# Patient Record
Sex: Male | Born: 1955 | Race: White | Hispanic: No | Marital: Married | Smoking: Never smoker
Health system: Southern US, Community
[De-identification: ages and names within clinical notes are randomized; demographics above are authoritative.]

## PROBLEM LIST (undated history)

## (undated) DIAGNOSIS — N400 Enlarged prostate without lower urinary tract symptoms: Secondary | ICD-10-CM

## (undated) DIAGNOSIS — K635 Polyp of colon: Secondary | ICD-10-CM

## (undated) DIAGNOSIS — E785 Hyperlipidemia, unspecified: Secondary | ICD-10-CM

## (undated) DIAGNOSIS — I10 Essential (primary) hypertension: Secondary | ICD-10-CM

## (undated) HISTORY — DX: Essential (primary) hypertension: I10

## (undated) HISTORY — DX: Benign prostatic hyperplasia without lower urinary tract symptoms: N40.0

## (undated) HISTORY — PX: SPINE SURGERY: SHX786

## (undated) HISTORY — DX: Polyp of colon: K63.5

## (undated) HISTORY — DX: Hyperlipidemia, unspecified: E78.5

---

## 1999-12-06 ENCOUNTER — Encounter: Admission: RE | Admit: 1999-12-06 | Discharge: 2000-03-05 | Payer: Self-pay | Admitting: Family Medicine

## 2001-08-13 ENCOUNTER — Encounter: Admission: RE | Admit: 2001-08-13 | Discharge: 2001-11-11 | Payer: Self-pay | Admitting: Family Medicine

## 2004-02-22 ENCOUNTER — Ambulatory Visit (HOSPITAL_COMMUNITY)
Admission: RE | Admit: 2004-02-22 | Discharge: 2004-02-22 | Payer: Self-pay | Admitting: Physical Medicine and Rehabilitation

## 2007-09-30 ENCOUNTER — Ambulatory Visit: Payer: Self-pay | Admitting: Gastroenterology

## 2007-10-03 HISTORY — PX: COLONOSCOPY: SHX174

## 2007-10-07 ENCOUNTER — Ambulatory Visit: Payer: Self-pay | Admitting: Gastroenterology

## 2007-10-07 ENCOUNTER — Encounter: Payer: Self-pay | Admitting: Gastroenterology

## 2007-11-18 ENCOUNTER — Ambulatory Visit (HOSPITAL_COMMUNITY): Admission: RE | Admit: 2007-11-18 | Discharge: 2007-11-18 | Payer: Self-pay | Admitting: Family Medicine

## 2007-11-25 ENCOUNTER — Ambulatory Visit (HOSPITAL_COMMUNITY): Admission: RE | Admit: 2007-11-25 | Discharge: 2007-11-25 | Payer: Self-pay | Admitting: Urology

## 2009-07-21 ENCOUNTER — Inpatient Hospital Stay (HOSPITAL_COMMUNITY): Admission: EM | Admit: 2009-07-21 | Discharge: 2009-07-25 | Payer: Self-pay | Admitting: Emergency Medicine

## 2009-07-21 ENCOUNTER — Encounter (INDEPENDENT_AMBULATORY_CARE_PROVIDER_SITE_OTHER): Payer: Self-pay | Admitting: Internal Medicine

## 2009-07-21 ENCOUNTER — Ambulatory Visit: Payer: Self-pay | Admitting: Cardiovascular Disease

## 2009-07-21 ENCOUNTER — Ambulatory Visit: Payer: Self-pay | Admitting: Infectious Disease

## 2011-01-05 LAB — CLOSTRIDIUM DIFFICILE EIA
C difficile Toxins A+B, EIA: NEGATIVE
C difficile Toxins A+B, EIA: NEGATIVE

## 2011-01-05 LAB — HIV-1 RNA ULTRAQUANT REFLEX TO GENTYP+
HIV 1 RNA Quant: <48 copies/mL (ref <48)
HIV-1 RNA Quant, Log: <1.68 {Log} (ref <1.68)

## 2011-01-05 LAB — COMPREHENSIVE METABOLIC PANEL
ALT: 78 U/L — ABNORMAL HIGH (ref 0–53)
AST: 37 U/L (ref 0–37)
Albumin: 2 g/dL — ABNORMAL LOW (ref 3.5–5.2)
Calcium: 7.1 mg/dL — ABNORMAL LOW (ref 8.4–10.5)
Creatinine, Ser: 0.76 mg/dL (ref 0.4–1.5)
GFR calc Af Amer: >60 mL/min (ref >60)
Sodium: 129 mEq/L — ABNORMAL LOW (ref 135–145)
Total Protein: 4.7 g/dL — ABNORMAL LOW (ref 6.0–8.3)

## 2011-01-05 LAB — TSH: TSH: 1.315 u[IU]/mL (ref 0.350–4.500)

## 2011-01-05 LAB — DIFFERENTIAL
Lymphocytes Relative: 11 % — ABNORMAL LOW (ref 12–46)
Lymphs Abs: 1 10*3/uL (ref 0.7–4.0)
Neutro Abs: 7.4 10*3/uL (ref 1.7–7.7)

## 2011-01-05 LAB — BASIC METABOLIC PANEL
BUN: 12 mg/dL (ref 6–23)
CO2: 23 mEq/L (ref 19–32)
Calcium: 7.7 mg/dL — ABNORMAL LOW (ref 8.4–10.5)
Calcium: 8.1 mg/dL — ABNORMAL LOW (ref 8.4–10.5)
Calcium: 8.2 mg/dL — ABNORMAL LOW (ref 8.4–10.5)
Chloride: 106 mEq/L (ref 96–112)
Creatinine, Ser: 0.7 mg/dL (ref 0.4–1.5)
Creatinine, Ser: 0.71 mg/dL (ref 0.4–1.5)
GFR calc Af Amer: >60 mL/min (ref >60)
GFR calc Af Amer: >60 mL/min (ref >60)
GFR calc Af Amer: >60 mL/min (ref >60)
GFR calc non Af Amer: >60 mL/min (ref >60)
GFR calc non Af Amer: >60 mL/min (ref >60)
GFR calc non Af Amer: >60 mL/min (ref >60)
GFR calc non Af Amer: >60 mL/min (ref >60)
Glucose, Bld: 132 mg/dL — ABNORMAL HIGH (ref 70–99)
Glucose, Bld: 92 mg/dL (ref 70–99)
Glucose, Bld: 97 mg/dL (ref 70–99)
Potassium: 3.6 mEq/L (ref 3.5–5.1)
Potassium: 4.1 mEq/L (ref 3.5–5.1)
Sodium: 133 mEq/L — ABNORMAL LOW (ref 135–145)
Sodium: 134 mEq/L — ABNORMAL LOW (ref 135–145)
Sodium: 136 mEq/L (ref 135–145)

## 2011-01-05 LAB — CULTURE, BLOOD (ROUTINE X 2)

## 2011-01-05 LAB — POCT I-STAT, CHEM 8
BUN: 17 mg/dL (ref 6–23)
Calcium, Ion: 1.11 mmol/L — ABNORMAL LOW (ref 1.12–1.32)
Creatinine, Ser: 1 mg/dL (ref 0.4–1.5)
Glucose, Bld: 140 mg/dL — ABNORMAL HIGH (ref 70–99)
Hemoglobin: 11.9 g/dL — ABNORMAL LOW (ref 13.0–17.0)
Potassium: 3 mEq/L — ABNORMAL LOW (ref 3.5–5.1)
TCO2: 20 mmol/L (ref 0–100)

## 2011-01-05 LAB — RPR: RPR Ser Ql: NONREACTIVE

## 2011-01-05 LAB — ENTEROVIRUS PCR: Enterovirus PCR: NOT DETECTED

## 2011-01-05 LAB — CBC
HCT: 32.1 % — ABNORMAL LOW (ref 39.0–52.0)
HCT: 33 % — ABNORMAL LOW (ref 39.0–52.0)
HCT: 37.9 % — ABNORMAL LOW (ref 39.0–52.0)
Hemoglobin: 12.2 g/dL — ABNORMAL LOW (ref 13.0–17.0)
Hemoglobin: 13.1 g/dL (ref 13.0–17.0)
MCHC: 34.5 g/dL (ref 30.0–36.0)
MCHC: 34.7 g/dL (ref 30.0–36.0)
MCV: 90.9 fL (ref 78.0–100.0)
Platelets: 176 10*3/uL (ref 150–400)
Platelets: 206 10*3/uL (ref 150–400)
Platelets: 214 10*3/uL (ref 150–400)
Platelets: 286 10*3/uL (ref 150–400)
RBC: 3.31 MIL/uL — ABNORMAL LOW (ref 4.22–5.81)
RBC: 3.63 MIL/uL — ABNORMAL LOW (ref 4.22–5.81)
RBC: 4.14 MIL/uL — ABNORMAL LOW (ref 4.22–5.81)
RDW: 13.4 % (ref 11.5–15.5)
RDW: 13.5 % (ref 11.5–15.5)
RDW: 13.6 % (ref 11.5–15.5)
RDW: 13.6 % (ref 11.5–15.5)
WBC: 7.2 10*3/uL (ref 4.0–10.5)
WBC: 7.6 10*3/uL (ref 4.0–10.5)
WBC: 7.6 10*3/uL (ref 4.0–10.5)

## 2011-01-05 LAB — CSF CELL COUNT WITH DIFFERENTIAL
Lymphs, CSF: 21 % — ABNORMAL LOW (ref 40–80)
RBC Count, CSF: 21 /mm3 — ABNORMAL HIGH
Segmented Neutrophils-CSF: 47 % — ABNORMAL HIGH (ref 0–6)
Tube #: 1
WBC, CSF: 190 /mm3 — ABNORMAL HIGH (ref 0–5)

## 2011-01-05 LAB — LACTIC ACID, PLASMA: Lactic Acid, Venous: 0.9 mmol/L (ref 0.5–2.2)

## 2011-01-05 LAB — HEPATIC FUNCTION PANEL
ALT: 86 U/L — ABNORMAL HIGH (ref 0–53)
AST: 41 U/L — ABNORMAL HIGH (ref 0–37)
Alkaline Phosphatase: 92 U/L (ref 39–117)
Bilirubin, Direct: 0.2 mg/dL (ref 0.0–0.3)
Total Bilirubin: 0.5 mg/dL (ref 0.3–1.2)

## 2011-01-05 LAB — PROTIME-INR
INR: 1.06 (ref 0.00–1.49)
Prothrombin Time: 13.7 seconds (ref 11.6–15.2)

## 2011-01-05 LAB — URINE CULTURE

## 2011-01-05 LAB — CARDIAC PANEL(CRET KIN+CKTOT+MB+TROPI): Troponin I: 0.05 ng/mL (ref 0.00–0.06)

## 2011-01-05 LAB — URINALYSIS, ROUTINE W REFLEX MICROSCOPIC
Bilirubin Urine: NEGATIVE
Glucose, UA: NEGATIVE mg/dL
Nitrite: NEGATIVE
Specific Gravity, Urine: 1.022 (ref 1.005–1.030)

## 2011-01-05 LAB — MAGNESIUM: Magnesium: 2 mg/dL (ref 1.5–2.5)

## 2011-01-05 LAB — MISCELLANEOUS TEST

## 2011-01-05 LAB — CRYPTOCOCCAL ANTIGEN, CSF: Crypto Ag: NEGATIVE

## 2011-01-05 LAB — HIV ANTIBODY (ROUTINE TESTING W REFLEX): HIV: NONREACTIVE

## 2011-01-05 LAB — GRAM STAIN

## 2011-01-05 LAB — POCT CARDIAC MARKERS: Myoglobin, poc: 129 ng/mL (ref 12–200)

## 2011-01-05 LAB — LEGIONELLA ANTIGEN, URINE: Legionella Antigen, Urine: NEGATIVE

## 2011-01-05 LAB — PROTEIN, CSF: Total  Protein, CSF: 95 mg/dL — ABNORMAL HIGH (ref 15–45)

## 2011-01-05 LAB — STREP PNEUMONIAE URINARY ANTIGEN: Strep Pneumo Urinary Antigen: NEGATIVE

## 2011-01-05 LAB — CSF CULTURE W GRAM STAIN: Culture: NO GROWTH

## 2011-01-05 LAB — URINE MICROSCOPIC-ADD ON

## 2012-08-23 ENCOUNTER — Encounter: Payer: Self-pay | Admitting: Gastroenterology

## 2013-05-29 ENCOUNTER — Encounter: Payer: Self-pay | Admitting: Gastroenterology

## 2013-12-03 ENCOUNTER — Encounter (INDEPENDENT_AMBULATORY_CARE_PROVIDER_SITE_OTHER): Payer: Self-pay

## 2013-12-03 ENCOUNTER — Telehealth: Payer: Self-pay | Admitting: Family Medicine

## 2013-12-03 ENCOUNTER — Ambulatory Visit (INDEPENDENT_AMBULATORY_CARE_PROVIDER_SITE_OTHER): Payer: BC Managed Care – PPO | Admitting: Family Medicine

## 2013-12-03 VITALS — BP 144/82 | HR 69 | Temp 98.4°F | Ht 68.5 in | Wt 177.0 lb

## 2013-12-03 DIAGNOSIS — M545 Low back pain, unspecified: Secondary | ICD-10-CM

## 2013-12-03 MED ORDER — METHYLPREDNISOLONE (PAK) 4 MG PO TABS
ORAL_TABLET | ORAL | Status: DC
Start: 2013-12-03 — End: 2014-12-04

## 2013-12-03 MED ORDER — CYCLOBENZAPRINE HCL 5 MG PO TABS: 5.0000 mg | ORAL_TABLET | Freq: Three times a day (TID) | ORAL | Status: DC | PRN

## 2013-12-03 NOTE — Patient Instructions (Signed)
Back Pain, Adult Low back pain is very common. About 1 in 5 people have back pain.The cause of low back pain is rarely dangerous. The pain often gets better over time.About half of people with a sudden onset of back pain feel better in just 2 weeks. About 8 in 10 people feel better by 6 weeks.  CAUSES Some common causes of back pain include:  Strain of the muscles or ligaments supporting the spine.  Wear and tear (degeneration) of the spinal discs.  Arthritis.  Direct injury to the back. DIAGNOSIS Most of the time, the direct cause of low back pain is not known.However, back pain can be treated effectively even when the exact cause of the pain is unknown.Answering your caregiver's questions about your overall health and symptoms is one of the most accurate ways to make sure the cause of your pain is not dangerous. If your caregiver needs more information, he or she may order lab work or imaging tests (X-rays or MRIs).However, even if imaging tests show changes in your back, this usually does not require surgery. HOME CARE INSTRUCTIONS For many people, back pain returns.Since low back pain is rarely dangerous, it is often a condition that people can learn to manageon their own.   Remain active. It is stressful on the back to sit or stand in one place. Do not sit, drive, or stand in one place for more than 30 minutes at a time. Take short walks on level surfaces as soon as pain allows.Try to increase the length of time you walk each day.  Do not stay in bed.Resting more than 1 or 2 days can delay your recovery.  Do not avoid exercise or work.Your body is made to move.It is not dangerous to be active, even though your back may hurt.Your back will likely heal faster if you return to being active before your pain is gone.  Pay attention to your body when you bend and lift. Many people have less discomfortwhen lifting if they bend their knees, keep the load close to their bodies,and  avoid twisting. Often, the most comfortable positions are those that put less stress on your recovering back.  Find a comfortable position to sleep. Use a firm mattress and lie on your side with your knees slightly bent. If you lie on your back, put a pillow under your knees.  Only take over-the-counter or prescription medicines as directed by your caregiver. Over-the-counter medicines to reduce pain and inflammation are often the most helpful.Your caregiver may prescribe muscle relaxant drugs.These medicines help dull your pain so you can more quickly return to your normal activities and healthy exercise.  Put ice on the injured area.  Put ice in a plastic bag.  Place a towel between your skin and the bag.  Leave the ice on for 15-20 minutes, 03-04 times a day for the first 2 to 3 days. After that, ice and heat may be alternated to reduce pain and spasms.  Ask your caregiver about trying back exercises and gentle massage. This may be of some benefit.  Avoid feeling anxious or stressed.Stress increases muscle tension and can worsen back pain.It is important to recognize when you are anxious or stressed and learn ways to manage it.Exercise is a great option. SEEK MEDICAL CARE IF:  You have pain that is not relieved with rest or medicine.  You have pain that does not improve in 1 week.  You have new symptoms.  You are generally not feeling well. SEEK   IMMEDIATE MEDICAL CARE IF:   You have pain that radiates from your back into your legs.  You develop new bowel or bladder control problems.  You have unusual weakness or numbness in your arms or legs.  You develop nausea or vomiting.  You develop abdominal pain.  You feel faint. Document Released: 09/18/2005 Document Revised: 03/19/2012 Document Reviewed: 02/06/2011 Exit[employer] Patient Information 2014 [employer].  

## 2013-12-03 NOTE — Telephone Encounter (Signed)
Patient walked into office

## 2013-12-03 NOTE — Progress Notes (Signed)
   Subjective:    Patient ID: Bradley Terry, male    DOB: 05/19/1956, 58 y.o.   MRN: [id number]  HPI This 58 y.o. male presents for evaluation of back pain from moving furniture for the last few days. He states he gets this on occasion and takes a medrol dose pack and it works.  He denies Any radiation of the pain in his back..   Review of Systems C/o back pain No chest pain, SOB, HA, dizziness, vision change, N/V, diarrhea, constipation, dysuria, urinary urgency or frequency, myalgias, arthralgias or rash.     Objective:   Physical Exam  Vital signs noted  Well developed well nourished male.  HEENT - Head atraumatic Normocephalic Respiratory - Lungs CTA bilateral Cardiac - RRR S1 and S2 without murmur MS - TTP lumbar paraspinous muscles Neuro - Grossly intact.      Assessment & Plan:  Lumbago - Plan: methylPREDNIsolone (MEDROL DOSPACK) 4 MG tablet, cyclobenzaprine (FLEXERIL) 5 MG tablet Follow up prn if not better.  Lysbeth Penner FNP

## 2014-12-04 ENCOUNTER — Ambulatory Visit (INDEPENDENT_AMBULATORY_CARE_PROVIDER_SITE_OTHER): Payer: BLUE CROSS/BLUE SHIELD | Admitting: Family Medicine

## 2014-12-04 ENCOUNTER — Encounter (INDEPENDENT_AMBULATORY_CARE_PROVIDER_SITE_OTHER): Payer: Self-pay

## 2014-12-04 ENCOUNTER — Encounter: Payer: Self-pay | Admitting: Family Medicine

## 2014-12-04 VITALS — BP 126/77 | HR 72 | Temp 97.2°F | Ht 67.5 in | Wt 184.2 lb

## 2014-12-04 DIAGNOSIS — Z0000 Encounter for general adult medical examination without abnormal findings: Secondary | ICD-10-CM

## 2014-12-04 DIAGNOSIS — L989 Disorder of the skin and subcutaneous tissue, unspecified: Secondary | ICD-10-CM

## 2014-12-04 DIAGNOSIS — Z1212 Encounter for screening for malignant neoplasm of rectum: Secondary | ICD-10-CM | POA: Diagnosis not present

## 2014-12-04 LAB — POCT CBC
Granulocyte percent: 58.5 %G (ref 37–80)
HEMATOCRIT: 52.8 % (ref 43.5–53.7)
HEMOGLOBIN: 16.2 g/dL (ref 14.1–18.1)
Lymph, poc: 1.9 (ref 0.6–3.4)
MCH: 28.2 pg (ref 27–31.2)
MCHC: 30.6 g/dL — AB (ref 31.8–35.4)
MCV: 92.1 fL (ref 80–97)
MPV: 6.6 fL (ref 0–99.8)
PLATELET COUNT, POC: 237 10*3/uL (ref 142–424)
POC Granulocyte: 3.3 (ref 2–6.9)
POC LYMPH PERCENT: 34 %L (ref 10–50)
RBC: 5.73 M/uL (ref 4.69–6.13)
RDW, POC: 14.3 %
WBC: 5.6 10*3/uL (ref 4.6–10.2)

## 2014-12-04 NOTE — Progress Notes (Signed)
Subjective:  Patient ID: Bradley Terry, male    DOB: April 01, 1956  Age: 59 y.o. MRN: [id number]  CC: Annual Exam   HPI Bradley Terry presents for annual exam History Bradley Terry has no past medical history on file.   He has past surgical history that includes Spine surgery (1982, 1986).   His family history includes ALS in his father; COPD in his mother.He reports that he has never smoked. He has never used smokeless tobacco. He reports that he does not drink alcohol or use illicit drugs.  No current outpatient prescriptions on file prior to visit.   No current facility-administered medications on file prior to visit.    ROS Review of Systems  Constitutional: Negative for fever, chills, diaphoresis, activity change, appetite change, fatigue and unexpected weight change.  HENT: Negative for congestion, ear pain, hearing loss, postnasal drip, rhinorrhea, sore throat, tinnitus and trouble swallowing.   Eyes: Negative for photophobia, pain, discharge and redness.  Respiratory: Negative for apnea, cough, choking, chest tightness, shortness of breath, wheezing and stridor.   Cardiovascular: Negative for chest pain, palpitations and leg swelling.  Gastrointestinal: Negative for nausea, vomiting, abdominal pain, diarrhea, constipation, blood in stool and abdominal distention.  Endocrine: Negative for cold intolerance, heat intolerance, polydipsia, polyphagia and polyuria.  Genitourinary: Negative for dysuria, urgency, frequency, hematuria, flank pain, enuresis, difficulty urinating and genital sores.  Musculoskeletal: Negative for joint swelling and arthralgias.  Skin: Negative for color change, rash and wound.  Allergic/Immunologic: Negative for immunocompromised state.  Neurological: Negative for dizziness, tremors, seizures, syncope, facial asymmetry, speech difficulty, weakness, light-headedness, numbness and headaches.  Hematological: Does not bruise/bleed easily.    Psychiatric/Behavioral: Negative for suicidal ideas, hallucinations, behavioral problems, confusion, sleep disturbance, dysphoric mood, decreased concentration and agitation. The patient is not nervous/anxious and is not hyperactive.     Objective:  BP 126/77 mmHg  Pulse 72  Temp(Src) 97.2 F (36.2 C) (Oral)  Ht 5' 7.5" (1.715 m)  Wt 184 lb 3.2 oz (83.553 kg)  BMI 28.41 kg/m2  BP Readings from Last 3 Encounters:  12/04/14 126/77  12/03/13 144/82    Wt Readings from Last 3 Encounters:  12/04/14 184 lb 3.2 oz (83.553 kg)  12/03/13 177 lb (80.287 kg)     Physical Exam  Constitutional: He is oriented to person, place, and time. He appears well-developed and well-nourished.  HENT:  Head: Normocephalic and atraumatic.  Mouth/Throat: Oropharynx is clear and moist.  Eyes: EOM are normal. Pupils are equal, round, and reactive to light.  Neck: Normal range of motion. No tracheal deviation present. No thyromegaly present.  Cardiovascular: Normal rate, regular rhythm and normal heart sounds.  Exam reveals no gallop and no friction rub.   No murmur heard. Pulmonary/Chest: Breath sounds normal. He has no wheezes. He has no rales.  Abdominal: Soft. He exhibits no mass. There is no tenderness.  Genitourinary: Rectum normal, prostate normal and penis normal.  Musculoskeletal: Normal range of motion. He exhibits no edema.  Neurological: He is alert and oriented to person, place, and time.  Skin: Skin is warm and dry.  3 erythematous lesions with scale noted on the upper arm on the left and forearm left and right area  Psychiatric: He has a normal mood and affect.    No results found for: HGBA1C  Lab Results  Component Value Date   WBC 5.6 12/04/2014   HGB 16.2 12/04/2014   HCT 52.8 12/04/2014   PLT 316 07/25/2009   GLUCOSE 95 12/04/2014  CHOL 228* 12/04/2014   TRIG 80 12/04/2014   HDL 77 12/04/2014   ALT 26 12/04/2014   AST 24 12/04/2014   NA 138 12/04/2014   K 4.8  12/04/2014   CL 100 12/04/2014   CREATININE 0.77 12/04/2014   BUN 12 12/04/2014   CO2 23 12/04/2014   TSH 1.600 12/04/2014   PSA 2.1 12/04/2014   INR 1.06 07/20/2009    Mr Brain W Wo Contrast  07/22/2009   Clinical Data: Headache.  Nausea and vomiting.  Question of meningeal infection.  Sepsis.   MRI HEAD WITHOUT AND WITH CONTRAST   Technique:  Multiplanar, multiecho pulse sequences of the brain and surrounding structures were obtained according to standard protocol without and with intravenous contrast   Contrast: 20 ml Multihance.   Comparison: CT head 07/20/2009.   Findings: No acute intracranial abnormalities are present. Specifically, there is no evidence for acute infarct, hemorrhage, mass, hydrocephalus, or extra-axial fluid collection.  The postcontrast images demonstrate no areas of pathologic enhancement. Specifically, there is no significant dural enhancement.  Scattered periventricular and subcortical T2 hyperintensities are slightly greater in number than expected for age.  There is no significant effusion abnormality or enhancement associated with any of these lesions.  Flow is present in the major intracranial arteries.  The globes and orbits are intact.  Minimal mucosal thickening is present within the anterior ethmoid air cells, the maxillary sinuses bilaterally and frontal sinuses.  The mastoid air cells are clear.   IMPRESSION:   1.  No acute intracranial abnormality or evidence for significant meningeal infection or inflammation. 2.  Scattered periventricular and subcortical T2 hyperintensities are slightly greater than expected for age. The finding is nonspecific but can be seen in the setting of chronic microvascular ischemia, a demyelinating process such as multiple sclerosis, vasculitis, complicated migraine headaches, or as the sequelae of a prior infectious or inflammatory process. 3.  Minimal sinus disease.  Provider: Lynelle Doctor  Dg Chest Portable 1 View  07/21/2009    Clinical Data: PICC placement.   PORTABLE CHEST - 1 VIEW   Comparison: 07/20/2009   Findings: Trachea is midline.  Heart size stable.  Right PICC tip projects over the SVC.  Lungs are low in volume with bibasilar atelectasis.  There may be a left pleural effusion.   IMPRESSION: Low lung volumes with mild bibasilar atelectasis.  Cannot exclude left pleural effusion.  Provider: Elvera Lennox  Dg Fluoro Guide Ndl Plc/bx  07/21/2009   Clinical Data:  Fever.  Question meningitis   DIAGNOSTIC LUMBAR PUNCTURE UNDER FLUOROSCOPIC GUIDANCE   Fluoroscopy time:  0.80 minutes.   Technique: Request confirmed.  Images reviewed. Informed consent was obtained from the patient prior to the procedure, including potential complications of headache, infection and / or bleeding Time out performed With the patient prone, the lower back was prepped with Betadine.  1% Lidocaine was used for local anesthesia. Lumbar puncture was performed at the L3-4 level using a 20 gauge needle with return of clear CSF with an opening pressure of 23 cm water.   9 ml of CSF were obtained for laboratory studies.  The patient tolerated the procedure well and there were no apparent complications.   IMPRESSION: Successful L3-4 lumbar puncture.   Opening pressure 23 cm.  Provider: Tally Due   Assessment & Plan:   Faris was seen today for annual exam.  Diagnoses and all orders for this visit:  Wellness examination Orders: -     POCT CBC -  CMP14+EGFR -     NMR, lipoprofile -     PSA, total and free -     Thyroid Panel With TSH -     Vit D  25 hydroxy (rtn osteoporosis monitoring)  Screening for malignant neoplasm of the rectum Orders: -     Fecal occult blood, imunochemical  Skin lesion of left arm  Skin lesion of right arm   I have discontinued Mr. Delpilar methylPREDNIsolone and cyclobenzaprine.  No orders of the defined types were placed in this encounter.     Follow-up: Return in about 2 weeks (around  12/18/2014). We'll perform punch biopsies of the 3 above described lesions Claretta Fraise, M.D.

## 2014-12-05 LAB — THYROID PANEL WITH TSH
FREE THYROXINE INDEX: 1.5 (ref 1.2–4.9)
T3 UPTAKE RATIO: 28 % (ref 24–39)
T4 TOTAL: 5.5 ug/dL (ref 4.5–12.0)
TSH: 1.6 u[IU]/mL (ref 0.450–4.500)

## 2014-12-05 LAB — CMP14+EGFR
ALBUMIN: 4.5 g/dL (ref 3.6–4.8)
ALT: 26 IU/L (ref 0–44)
AST: 24 IU/L (ref 0–40)
Albumin/Globulin Ratio: 1.6 (ref 1.1–2.5)
Alkaline Phosphatase: 57 IU/L (ref 39–117)
BUN/Creatinine Ratio: 16 (ref 10–22)
BUN: 12 mg/dL (ref 8–27)
Bilirubin Total: 0.5 mg/dL (ref 0.0–1.2)
CALCIUM: 9.3 mg/dL (ref 8.6–10.2)
CHLORIDE: 100 mmol/L (ref 97–108)
CO2: 23 mmol/L (ref 18–29)
Creatinine, Ser: 0.77 mg/dL (ref 0.76–1.27)
GFR calc non Af Amer: 99 mL/min/{1.73_m2} (ref >59)
GFR, EST AFRICAN AMERICAN: 114 mL/min/{1.73_m2} (ref >59)
GLOBULIN, TOTAL: 2.8 g/dL (ref 1.5–4.5)
Glucose: 95 mg/dL (ref 65–99)
Potassium: 4.8 mmol/L (ref 3.5–5.2)
Sodium: 138 mmol/L (ref 134–144)
Total Protein: 7.3 g/dL (ref 6.0–8.5)

## 2014-12-05 LAB — NMR, LIPOPROFILE
CHOLESTEROL: 228 mg/dL — AB (ref 100–199)
HDL CHOLESTEROL BY NMR: 77 mg/dL (ref >39)
HDL Particle Number: 36.8 umol/L (ref >=30.5)
LDL Particle Number: 1706 nmol/L — ABNORMAL HIGH (ref <1000)
LDL SIZE: 21.7 nm (ref >20.5)
LDL-C: 135 mg/dL — AB (ref 0–99)
Small LDL Particle Number: 384 nmol/L (ref <=527)
Triglycerides by NMR: 80 mg/dL (ref 0–149)

## 2014-12-05 LAB — VITAMIN D 25 HYDROXY (VIT D DEFICIENCY, FRACTURES): VIT D 25 HYDROXY: 23 ng/mL — AB (ref 30.0–100.0)

## 2014-12-05 LAB — PSA, TOTAL AND FREE
PSA, Free Pct: 18.1 %
PSA, Free: 0.38 ng/mL
PSA: 2.1 ng/mL (ref 0.0–4.0)

## 2014-12-06 LAB — FECAL OCCULT BLOOD, IMMUNOCHEMICAL: FECAL OCCULT BLD: NEGATIVE

## 2014-12-07 ENCOUNTER — Telehealth: Payer: Self-pay | Admitting: *Deleted

## 2014-12-07 NOTE — Telephone Encounter (Signed)
Please call to discuss lab results 

## 2014-12-07 NOTE — Telephone Encounter (Signed)
-----   Message from Claretta Fraise, MD sent at 12/06/2014  9:05 PM EST ----- Dear Bradley Terry,   Your Vitamin D is quite low. You need a high dose supplement. Use 50,000 units twice weekly for two months. I will send that in as a 1 time only prescription.  Then switch to OTC Vitamin D 2000 units daily. Recheck vitamin D level with office visit in 6 months. Your cholesterol was also mildly elevated. I recommend to try taking 2 Krill oil capsules 300 mg each daily. This along with a low-fat diet should help. I recommend follow-up in 6 months. Drop by a couple of days ahead of time for your cholesterol level. Best regards, Claretta Fraise M.D.

## 2014-12-08 ENCOUNTER — Telehealth: Payer: Self-pay | Admitting: Family Medicine

## 2014-12-08 NOTE — Telephone Encounter (Signed)
Patients wife aware of results.

## 2014-12-09 ENCOUNTER — Telehealth: Payer: Self-pay | Admitting: Family Medicine

## 2014-12-09 MED ORDER — VITAMIN D (ERGOCALCIFEROL) 1.25 MG (50000 UNIT) PO CAPS: 50000.0000 [IU] | ORAL_CAPSULE | ORAL | Status: DC

## 2014-12-09 NOTE — Telephone Encounter (Signed)
RX sent into [facility] Pt's wife notified

## 2014-12-23 ENCOUNTER — Ambulatory Visit (INDEPENDENT_AMBULATORY_CARE_PROVIDER_SITE_OTHER): Payer: BLUE CROSS/BLUE SHIELD | Admitting: Family Medicine

## 2014-12-23 ENCOUNTER — Encounter: Payer: Self-pay | Admitting: Family Medicine

## 2014-12-23 VITALS — BP 133/76 | HR 74 | Temp 97.0°F | Wt 183.6 lb

## 2014-12-23 DIAGNOSIS — L989 Disorder of the skin and subcutaneous tissue, unspecified: Secondary | ICD-10-CM

## 2014-12-23 NOTE — Progress Notes (Signed)
Shave Biopsy Procedure Note  Pre-operative Diagnosis: Suspicious lesion  Post-operative Diagnosis: same  Locations:lower, upper  left arm, right forearm  Indications: change of character of lesions  Anesthesia: Lidocaine 2% with epinephrine without added sodium bicarbonate  Procedure Details  History of allergy to iodine: no  Patient informed of the risks (including bleeding and infection) and benefits of the  procedure and Verbal informed consent obtained.  The lesion and surrounding area were given a sterile prep using betadyne and draped in the usual sterile fashion. A punch was used to remove an area of skin approximately 4 mm from each of the three lesions. Hemostasis achieved with 4:0 nylon temporary stitch. Antibiotic ointment and a sterile dressing applied.  The specimens were sent for pathologic examination. The patient tolerated the procedure well.  EBL: 1 ml  Findings: Precancerous skin lesions, submitted  Condition: Stable  Complications: none.  Plan: 1. Instructed to keep the wound dry and covered for 24-48h and clean thereafter. 2. Warning signs of infection were reviewed.   3. Recommended that the patient use OTC acetaminophen as needed for pain.  4. Return in 10 days.

## 2014-12-24 ENCOUNTER — Other Ambulatory Visit: Payer: Self-pay | Admitting: Family Medicine

## 2014-12-28 LAB — PATHOLOGY

## 2014-12-29 LAB — PATHOLOGY

## 2014-12-29 LAB — PLEASE NOTE

## 2015-01-11 ENCOUNTER — Encounter: Payer: Self-pay | Admitting: Family Medicine

## 2015-01-11 ENCOUNTER — Ambulatory Visit (INDEPENDENT_AMBULATORY_CARE_PROVIDER_SITE_OTHER): Payer: BLUE CROSS/BLUE SHIELD | Admitting: Family Medicine

## 2015-01-11 VITALS — BP 113/69 | HR 60 | Temp 97.5°F | Ht 67.5 in | Wt 183.4 lb

## 2015-01-11 DIAGNOSIS — L570 Actinic keratosis: Secondary | ICD-10-CM | POA: Diagnosis not present

## 2015-01-11 MED ORDER — FLUOROURACIL 5 % EX CREA: TOPICAL_CREAM | Freq: Every day | CUTANEOUS | Status: DC

## 2015-01-11 NOTE — Progress Notes (Signed)
Subjective:  Patient ID: Bradley Terry, male    DOB: 03-Aug-1956  Age: 59 y.o. MRN: [id number]  CC: skin lesions   HPI [name] presents for suture removal. He had 3 lesions undergo punch biopsy 3 weeks ago. He has been delayed in coming in for the suture removal but he states that there has been no sign of infection. We've reviewed the biopsy report showing actinic keratoses on both full forearms but harmless seborrheic keratoses on the left upper arm.  History Malakai has no past medical history on file.   He has past surgical history that includes Spine surgery (1982, 1986).   His family history includes ALS in his father; COPD in his mother.He reports that he has never smoked. He has never used smokeless tobacco. He reports that he does not drink alcohol or use illicit drugs.  Current Outpatient Prescriptions on File Prior to Visit  Medication Sig Dispense Refill  . Vitamin D, Ergocalciferol, (DRISDOL) 50000 UNITS CAPS capsule Take 1 capsule (50,000 Units total) by mouth 2 (two) times a week. 24 capsule 0   No current facility-administered medications on file prior to visit.    ROS Review of Systems  Objective:  BP 113/69 mmHg  Pulse 60  Temp(Src) 97.5 F (36.4 C) (Oral)  Ht 5' 7.5" (1.715 m)  Wt 183 lb 6.4 oz (83.19 kg)  BMI 28.28 kg/m2  BP Readings from Last 3 Encounters:  01/11/15 113/69  12/23/14 133/76  12/04/14 126/77    Wt Readings from Last 3 Encounters:  01/11/15 183 lb 6.4 oz (83.19 kg)  12/23/14 183 lb 9.6 oz (83.28 kg)  12/04/14 184 lb 3.2 oz (83.553 kg)     Physical Exam  Constitutional: He appears well-developed and well-nourished.  Skin: Skin is warm and dry.  The 3 areas of biopsy were inspected showing excellent healing no signs of infection. The mattress suture was removed without difficulty from each location.    No results found for: HGBA1C  Lab Results  Component Value Date   WBC 5.6 12/04/2014   HGB 16.2 12/04/2014   HCT  52.8 12/04/2014   PLT 316 07/25/2009   GLUCOSE 95 12/04/2014   CHOL 228* 12/04/2014   TRIG 80 12/04/2014   HDL 77 12/04/2014   ALT 26 12/04/2014   AST 24 12/04/2014   NA 138 12/04/2014   K 4.8 12/04/2014   CL 100 12/04/2014   CREATININE 0.77 12/04/2014   BUN 12 12/04/2014   CO2 23 12/04/2014   TSH 1.600 12/04/2014   PSA 2.1 12/04/2014   INR 1.06 07/20/2009    Mr Brain W Wo Contrast  07/22/2009   Clinical Data: Headache.  Nausea and vomiting.  Question of meningeal infection.  Sepsis.   MRI HEAD WITHOUT AND WITH CONTRAST   Technique:  Multiplanar, multiecho pulse sequences of the brain and surrounding structures were obtained according to standard protocol without and with intravenous contrast   Contrast: 20 ml Multihance.   Comparison: CT head 07/20/2009.   Findings: No acute intracranial abnormalities are present. Specifically, there is no evidence for acute infarct, hemorrhage, mass, hydrocephalus, or extra-axial fluid collection.  The postcontrast images demonstrate no areas of pathologic enhancement. Specifically, there is no significant dural enhancement.  Scattered periventricular and subcortical T2 hyperintensities are slightly greater in number than expected for age.  There is no significant effusion abnormality or enhancement associated with any of these lesions.  Flow is present in the major intracranial arteries.  The globes  and orbits are intact.  Minimal mucosal thickening is present within the anterior ethmoid air cells, the maxillary sinuses bilaterally and frontal sinuses.  The mastoid air cells are clear.   IMPRESSION:   1.  No acute intracranial abnormality or evidence for significant meningeal infection or inflammation. 2.  Scattered periventricular and subcortical T2 hyperintensities are slightly greater than expected for age. The finding is nonspecific but can be seen in the setting of chronic microvascular ischemia, a demyelinating process such as multiple sclerosis,  vasculitis, complicated migraine headaches, or as the sequelae of a prior infectious or inflammatory process. 3.  Minimal sinus disease.  Provider: Lynelle Doctor  Dg Chest Portable 1 View  07/21/2009   Clinical Data: PICC placement.   PORTABLE CHEST - 1 VIEW   Comparison: 07/20/2009   Findings: Trachea is midline.  Heart size stable.  Right PICC tip projects over the SVC.  Lungs are low in volume with bibasilar atelectasis.  There may be a left pleural effusion.   IMPRESSION: Low lung volumes with mild bibasilar atelectasis.  Cannot exclude left pleural effusion.  Provider: Elvera Lennox  Dg Fluoro Guide Ndl Plc/bx  07/21/2009   Clinical Data:  Fever.  Question meningitis   DIAGNOSTIC LUMBAR PUNCTURE UNDER FLUOROSCOPIC GUIDANCE   Fluoroscopy time:  0.80 minutes.   Technique: Request confirmed.  Images reviewed. Informed consent was obtained from the patient prior to the procedure, including potential complications of headache, infection and / or bleeding Time out performed With the patient prone, the lower back was prepped with Betadine.  1% Lidocaine was used for local anesthesia. Lumbar puncture was performed at the L3-4 level using a 20 gauge needle with return of clear CSF with an opening pressure of 23 cm water.   9 ml of CSF were obtained for laboratory studies.  The patient tolerated the procedure well and there were no apparent complications.   IMPRESSION: Successful L3-4 lumbar puncture.   Opening pressure 23 cm.  Provider: Tally Due   Assessment & Plan:   Cejay was seen today for skin lesions.  Diagnoses and all orders for this visit:  Multiple actinic keratoses  Other orders -     fluorouracil (EFUDEX) 5 % cream; Apply topically daily. For 30 days   I am having Mr. Okray start on fluorouracil. I am also having him maintain his Vitamin D (Ergocalciferol).  Meds ordered this encounter  Medications  . fluorouracil (EFUDEX) 5 % cream    Sig: Apply topically daily. For 30 days     Dispense:  40 g    Refill:  1     Follow-up: Return in about 1 year (around 01/11/2016), or if symptoms worsen or fail to improve.  Claretta Fraise, M.D.

## 2015-01-15 ENCOUNTER — Ambulatory Visit: Payer: BLUE CROSS/BLUE SHIELD | Admitting: Physician Assistant

## 2015-12-03 ENCOUNTER — Ambulatory Visit (INDEPENDENT_AMBULATORY_CARE_PROVIDER_SITE_OTHER): Payer: Managed Care, Other (non HMO) | Admitting: Pediatrics

## 2015-12-03 ENCOUNTER — Encounter: Payer: Self-pay | Admitting: Pediatrics

## 2015-12-03 VITALS — BP 131/75 | HR 81 | Temp 97.9°F | Ht 67.5 in | Wt 182.0 lb

## 2015-12-03 DIAGNOSIS — J069 Acute upper respiratory infection, unspecified: Secondary | ICD-10-CM

## 2015-12-03 DIAGNOSIS — R6889 Other general symptoms and signs: Secondary | ICD-10-CM | POA: Diagnosis not present

## 2015-12-03 NOTE — Progress Notes (Signed)
    Subjective:    Patient ID: Bradley Terry, male    DOB: 02-Sep-1956, 60 y.o.   MRN: [id number]  CC: Generalized Body Aches; Shortness of Breath; Cough; and Nasal Congestion   HPI: Bradley Terry is a 60 y.o. male presenting for Generalized Body Aches; Shortness of Breath; Cough; and Nasal Congestion  Sick for past 6 days Had fever and bad body aches Short winded now Has congestion in his chest    Depression screen [facility] 2/9 12/23/2014 12/04/2014  Decreased Interest 0 0  Down, Depressed, Hopeless 0 0  PHQ - 2 Score 0 0     Relevant past medical, surgical, family and social history reviewed and updated as indicated. Interim medical history since our last visit reviewed. Allergies and medications reviewed and updated.    ROS: Per HPI unless specifically indicated above  History  Smoking status  . Never Smoker   Smokeless tobacco  . Never Used    Past Medical History There are no active problems to display for this patient.   No current outpatient prescriptions on file.   No current facility-administered medications for this visit.       Objective:    BP 131/75 mmHg  Pulse 81  Temp(Src) 97.9 F (36.6 C) (Oral)  Ht 5' 7.5" (1.715 m)  Wt 182 lb (82.555 kg)  BMI 28.07 kg/m2  SpO2 96%  Wt Readings from Last 3 Encounters:  12/03/15 182 lb (82.555 kg)  01/11/15 183 lb 6.4 oz (83.19 kg)  12/23/14 183 lb 9.6 oz (83.28 kg)     Gen: NAD, alert, cooperative with exam, NCAT, congested EYES: EOMI, no scleral injection or icterus ENT:  TMs pearly gray b/l, clear effusion behind L TM, OP with mild erythema LYMPH: no cervical LAD CV: NRRR, normal S1/S2, no murmur, distal pulses 2+ b/l Resp: CTABL, no wheezes, normal WOB Abd: +BS, soft, NTND.  Ext: No edema, warm Neuro: Alert and oriented, strength equal b/l UE and LE, coordination grossly normal MSK: normal muscle bulk     Assessment & Plan:    Bradley Terry was seen today for generalized body aches, shortness of  breath, cough and nasal congestion, flu-like symptoms ongoing for 6 days, no fevers now but with continued cough and congestion. Discussed symptomatic care.  Diagnoses and all orders for this visit:  Acute URI  Flu-like symptoms    Follow up plan: Return if symptoms worsen or fail to improve.  Assunta Found, MD [facility] 12/03/2015, 3:11 PM

## 2015-12-03 NOTE — Patient Instructions (Signed)
flonase nasal steroid Tylenol

## 2017-07-05 ENCOUNTER — Ambulatory Visit (INDEPENDENT_AMBULATORY_CARE_PROVIDER_SITE_OTHER): Payer: Managed Care, Other (non HMO) | Admitting: Family Medicine

## 2017-07-05 ENCOUNTER — Encounter: Payer: Self-pay | Admitting: Family Medicine

## 2017-07-05 VITALS — BP 137/84 | HR 71 | Temp 97.6°F | Ht 67.5 in | Wt 174.0 lb

## 2017-07-05 DIAGNOSIS — Z0000 Encounter for general adult medical examination without abnormal findings: Secondary | ICD-10-CM

## 2017-07-05 DIAGNOSIS — Z23 Encounter for immunization: Secondary | ICD-10-CM | POA: Diagnosis not present

## 2017-07-05 NOTE — Progress Notes (Signed)
Subjective:  Patient ID: Bradley Terry, male    DOB: January 13, 1956  Age: 61 y.o. MRN: [id number]  CC: Annual Exam (pt here today for CPE, no other concerns voiced.)   HPI DAELIN HASTE presents for CPE  Depression screen [facility] 2/9 07/05/2017 12/23/2014 12/04/2014  Decreased Interest 0 0 0  Down, Depressed, Hopeless 0 0 0  PHQ - 2 Score 0 0 0    History Cypher has a past medical history of Colon polyps.   He has a past surgical history that includes Spine surgery (1982, 1986).   His family history includes ALS in his father; COPD in his mother.He reports that he has never smoked. He has never used smokeless tobacco. He reports that he does not drink alcohol or use drugs.    ROS Review of Systems  Constitutional: Negative for activity change, appetite change, chills, diaphoresis, fatigue, fever and unexpected weight change.  HENT: Negative for congestion, ear pain, hearing loss, postnasal drip, rhinorrhea, sore throat, tinnitus and trouble swallowing.   Eyes: Negative for photophobia, pain, discharge and redness.  Respiratory: Negative for apnea, cough, choking, chest tightness, shortness of breath, wheezing and stridor.   Cardiovascular: Negative for chest pain, palpitations and leg swelling.  Gastrointestinal: Negative for abdominal distention, abdominal pain, blood in stool, constipation, diarrhea, nausea and vomiting.  Endocrine: Negative for cold intolerance, heat intolerance, polydipsia, polyphagia and polyuria.  Genitourinary: Negative for difficulty urinating, dysuria, enuresis, flank pain, frequency, genital sores, hematuria and urgency.  Musculoskeletal: Negative for arthralgias and joint swelling.  Skin: Negative for color change, rash and wound.  Allergic/Immunologic: Negative for immunocompromised state.  Neurological: Negative for dizziness, tremors, seizures, syncope, facial asymmetry, speech difficulty, weakness, light-headedness, numbness and headaches.    Hematological: Does not bruise/bleed easily.  Psychiatric/Behavioral: Negative for agitation, behavioral problems, confusion, decreased concentration, dysphoric mood, hallucinations, sleep disturbance and suicidal ideas. The patient is not nervous/anxious and is not hyperactive.     Objective:  BP 137/84   Pulse 71   Temp 97.6 F (36.4 C) (Oral)   Ht 5' 7.5" (1.715 m)   Wt 174 lb (78.9 kg)   BMI 26.85 kg/m   BP Readings from Last 3 Encounters:  07/05/17 137/84  12/03/15 131/75  01/11/15 113/69    Wt Readings from Last 3 Encounters:  07/05/17 174 lb (78.9 kg)  12/03/15 182 lb (82.6 kg)  01/11/15 183 lb 6.4 oz (83.2 kg)     Physical Exam  Constitutional: He is oriented to person, place, and time. He appears well-developed and well-nourished.  HENT:  Head: Normocephalic and atraumatic.  Mouth/Throat: Oropharynx is clear and moist.  Eyes: Pupils are equal, round, and reactive to light. EOM are normal.  Neck: Normal range of motion. No tracheal deviation present. No thyromegaly present.  Cardiovascular: Normal rate, regular rhythm and normal heart sounds.  Exam reveals no gallop and no friction rub.   No murmur heard. Pulmonary/Chest: Breath sounds normal. He has no wheezes. He has no rales.  Abdominal: Soft. He exhibits no mass. There is no tenderness.  Musculoskeletal: Normal range of motion. He exhibits no edema.  Neurological: He is alert and oriented to person, place, and time.  Skin: Skin is warm and dry.  Psychiatric: He has a normal mood and affect.      Assessment & Plan:   Bradley Terry was seen today for annual exam.  Diagnoses and all orders for this visit:  Well adult exam -     CBC with Differential/Platelet; Future -  CMP14+EGFR; Future -     Lipid panel; Future -     PSA, total and free; Future -     VITAMIN D 25 Hydroxy (Vit-D Deficiency, Fractures); Future -     Hepatitis C antibody; Future  Need for immunization against influenza -     Flu  Vaccine QUAD 36+ mos IM  Other orders -     Tdap vaccine greater than or equal to 7yo IM       Mr. Lindblad does not currently have medications on file.  Allergies as of 07/05/2017   No Known Allergies     Medication List    as of 07/05/2017 12:01 PM   You have not been prescribed any medications.      Follow-up: Return in about 1 year (around 07/05/2018).  Bradley Terry, M.D.

## 2017-07-26 ENCOUNTER — Ambulatory Visit: Payer: Managed Care, Other (non HMO) | Admitting: *Deleted

## 2017-07-26 NOTE — Progress Notes (Signed)
Pt came in for Hep C vaccine Informed pt that there is no vaccine for Hep C at this time Pt stated he was instructed by Dr Livia Snellen to contact insurance to check coverage Pt informed that Dr Livia Snellen has ordered lab for Hep C Pt declined labs Pt wants to wait and discuss with Dr Livia Snellen

## 2019-06-26 ENCOUNTER — Encounter: Payer: Self-pay | Admitting: Family Medicine

## 2019-06-26 ENCOUNTER — Ambulatory Visit (INDEPENDENT_AMBULATORY_CARE_PROVIDER_SITE_OTHER): Payer: Medicare Other | Admitting: Family Medicine

## 2019-06-26 ENCOUNTER — Encounter (INDEPENDENT_AMBULATORY_CARE_PROVIDER_SITE_OTHER): Payer: Self-pay

## 2019-06-26 ENCOUNTER — Other Ambulatory Visit: Payer: Self-pay

## 2019-06-26 VITALS — BP 146/85 | HR 70 | Temp 96.8°F | Resp 16 | Ht 67.5 in | Wt 180.8 lb

## 2019-06-26 DIAGNOSIS — Z23 Encounter for immunization: Secondary | ICD-10-CM | POA: Diagnosis not present

## 2019-06-26 DIAGNOSIS — Z0000 Encounter for general adult medical examination without abnormal findings: Secondary | ICD-10-CM | POA: Diagnosis not present

## 2019-06-26 LAB — URINALYSIS
Bilirubin, UA: NEGATIVE
Glucose, UA: NEGATIVE
Ketones, UA: NEGATIVE
Leukocytes,UA: NEGATIVE
Nitrite, UA: NEGATIVE
Protein,UA: NEGATIVE
RBC, UA: NEGATIVE
Specific Gravity, UA: 1.025 (ref 1.005–1.030)
Urobilinogen, Ur: 0.2 mg/dL (ref 0.2–1.0)
pH, UA: 7.5 (ref 5.0–7.5)

## 2019-06-26 NOTE — Progress Notes (Signed)
WELCOME TO MEDICARE (IPPE) VISIT  06/26/2019  Bradley Terry DOB:12-16-1955     PTW:[id number]  I explained that today's visit was for the purpose of health promotion and disease detection, as well as an introduction to Medicare and it's covered benefits.  I explained that no labs or other services would be performed today. If labs or other services are determined to be necessary the appropriate orders/referrals will be arranged for these to be done at a future date.  Bradley Terry is a 63 y.o. year old male primary care patient of Claretta Fraise, M.D..   MEDICAL AND SOCIAL HISTORY Past Medical History Past Medical History:  Diagnosis Date  . Colon polyps     Surgical History Past Surgical History:  Procedure Laterality Date  . [place]   lumbar surgery    Family History Family History  Problem Relation Terry of Onset  . COPD Mother   . ALS Father     Social History Social History   Socioeconomic History  . Marital status: Married    Spouse name: Not on file  . Number of children: 2  . Years of education: Not on file  . Highest education level: Not on file  Occupational History  . Not on file  Social Needs  . Financial resource strain: Not very hard  . Food insecurity    Worry: Not on file    Inability: Never true  . Transportation needs    Medical: No    Non-medical: No  Tobacco Use  . Smoking status: Never Smoker  . Smokeless tobacco: Never Used  Substance and Sexual Activity  . Alcohol use: No  . Drug use: No  . Sexual activity: Yes    Birth control/protection: Post-menopausal  Lifestyle  . Physical activity    Days per week: 5 days    Minutes per session: 60 min  . Stress: Only a little  Relationships  . Social Herbalist on phone: Not on file    Gets together: Not on file    Attends religious service: Not on file    Active member of club or organization: Not on file    Attends meetings of clubs or organizations: Not  on file    Relationship status: Not on file  Other Topics Concern  . Not on file  Social History Narrative  . Not on file    Current Medications & Allergies Pt. Takes no prescription medication. Patient has no known allergies.  Diet & Physical Activity     Pt consumes 3 meals a day and 1 snacks a day.  The patient feels that he mostly follow a Regular diet.   DEPRESSION SCREENING PHQ 2/9 Scores 06/26/2019 07/05/2017 12/23/2014 12/04/2014  PHQ - 2 Score 0 0 0 0     FUNCTIONAL ABILITY & LEVEL OF SAFETY Fall Risk Fall Risk  06/26/2019 07/05/2017 12/23/2014 12/04/2014  Falls in the past year? 0 No No No    Activities of Daily Living No flowsheet data found.  Cognitive Function       Normal Cognitive Function Screening today: Yes  Review of Systems  Constitutional: Negative for activity change, fatigue and unexpected weight change.  HENT: Negative for congestion, ear pain, hearing loss, postnasal drip and trouble swallowing.   Eyes: Negative for pain and visual disturbance.  Respiratory: Negative for cough, chest tightness and shortness of breath.   Cardiovascular: Negative for chest pain, palpitations and leg swelling.  Gastrointestinal: Negative  for abdominal distention, abdominal pain, blood in stool, constipation, diarrhea, nausea and vomiting.  Endocrine: Negative for cold intolerance, heat intolerance and polydipsia.  Genitourinary: Negative for difficulty urinating, dysuria, flank pain, frequency and urgency.  Musculoskeletal: Negative for arthralgias and joint swelling.  Skin: Negative for color change, rash and wound.  Neurological: Negative for dizziness, syncope, speech difficulty, weakness, light-headedness, numbness and headaches.  Hematological: Does not bruise/bleed easily.  Psychiatric/Behavioral: Negative for confusion, decreased concentration, dysphoric mood and sleep disturbance. The patient is not nervous/anxious.      EXAM  Today's Vitals   06/26/19  0913 06/26/19 0916  BP: (!) 149/86 (!) 146/85  Pulse: 75 70  Resp: 16   Temp: (!) 96.8 F (36 C)   TempSrc: Temporal   SpO2: 99%   Weight: 180 lb 12.8 oz (82 kg)   Height: 5' 7.5" (1.715 m)    Body mass index is 27.9 kg/m.  Physical Exam Constitutional:      Appearance: He is well-developed.  HENT:     Head: Normocephalic and atraumatic.  Eyes:     Pupils: Pupils are equal, round, and reactive to light.  Neck:     Musculoskeletal: Normal range of motion.     Thyroid: No thyromegaly.     Trachea: No tracheal deviation.  Cardiovascular:     Rate and Rhythm: Normal rate and regular rhythm.     Heart sounds: Normal heart sounds. No murmur. No friction rub. No gallop.   Pulmonary:     Breath sounds: Normal breath sounds. No wheezing or rales.  Abdominal:     General: Bowel sounds are normal. There is no distension.     Palpations: Abdomen is soft. There is no mass.     Tenderness: There is no abdominal tenderness.     Hernia: There is no hernia in the left inguinal area.  Genitourinary:    Penis: Normal.      Scrotum/Testes: Normal.  Musculoskeletal: Normal range of motion.  Lymphadenopathy:     Cervical: No cervical adenopathy.  Skin:    General: Skin is warm and dry.  Neurological:     Mental Status: He is alert and oriented to person, place, and time.      Health Maintenance Health Maintenance  Topic Date Due  . Hepatitis C Screening  Mar 01, 1956  . PNA vac Low Risk Adult (1 of 2 - PCV13) 02/15/2019  . INFLUENZA VACCINE  05/03/2019  . COLONOSCOPY  12/03/2020  . TETANUS/TDAP  07/06/2027  . HIV Screening  Completed     END OF LIFE PLANNING Advanced directives and power of attorney information specific to the patient were discussed.   No flowsheet data found.   Education, counseling, and referrals based on the information obtained/reviewed today: Continue regular exercise. Limit salt intake. See your eye doctor soon.  Education, counseling, and referral  for other preventive services: Written checklist was completed and given to pt for obtaining, as appropriate, the other preventive services that are covered as separate Medicare Part B benefits.  services that were reviewed with pt are:  -annual wellness visit (AWV) -Cardiovascular screening blood tests -Colorectal cancer screening -Diabetes screening test -Glaucoma screening -HIV screening -Medical nutrition therapy -Prostate cancer screening  -Seasonal influenza, pneumococcal, and Hep B vaccines -ultrasound screening for AAA  Of the above listed services, AAA ultrasound.  Patient did not have an additional complaint/problem that was discussed and evaluated today.  Patient was given opportunity to ask any additional questions regarding Medicare and covered benefits.  Patient was informed that Medicare does not provide coverage for routine physical exams.  I answered all questions to the best of my ability today.    1. Wellness examination   2. Welcome to Medicare preventive visit   3. Need for immunization against influenza     No orders of the defined types were placed in this encounter.   Orders Placed This Encounter  Procedures  . US AORTA MEDICARE SCREENING    Standing Status:   Future    Standing Expiration Date:   09/25/2019    Order Specific Question:   Reason for Exam (SYMPTOM  OR DIAGNOSIS REQUIRED)    Answer:   Welcome to [employer] screen    Order Specific Question:   Preferred imaging location?    Answer:   [facility]  . Flu Vaccine QUAD High Dose(Fluad)  . Pneumococcal polysaccharide vaccine 23-valent greater than or equal to 2yo subcutaneous/IM  . CBC with Differential/Platelet  . CMP14+EGFR    Order Specific Question:   Has the patient fasted?    Answer:   Yes  . Lipid panel    Order Specific Question:   Has the patient fasted?    Answer:   Yes  . TSH  . PSA Total (Reflex To Free)  . Hepatitis C antibody  . Urinalysis  . EKG 12-Lead     Follow up one year and as needed.  Claretta Fraise, MD

## 2019-06-27 LAB — PSA TOTAL (REFLEX TO FREE): Prostate Specific Ag, Serum: 2.2 ng/mL (ref 0.0–4.0)

## 2019-06-27 LAB — TSH: TSH: 1.08 u[IU]/mL (ref 0.450–4.500)

## 2019-06-27 LAB — CMP14+EGFR
ALT: 18 IU/L (ref 0–44)
AST: 21 IU/L (ref 0–40)
Albumin/Globulin Ratio: 1.9 (ref 1.2–2.2)
Albumin: 4.5 g/dL (ref 3.8–4.8)
Alkaline Phosphatase: 57 IU/L (ref 39–117)
BUN/Creatinine Ratio: 10 (ref 10–24)
BUN: 10 mg/dL (ref 8–27)
Bilirubin Total: 0.5 mg/dL (ref 0.0–1.2)
CO2: 25 mmol/L (ref 20–29)
Calcium: 9.3 mg/dL (ref 8.6–10.2)
Chloride: 100 mmol/L (ref 96–106)
Creatinine, Ser: 0.98 mg/dL (ref 0.76–1.27)
GFR calc Af Amer: 93 mL/min/{1.73_m2} (ref >59)
GFR calc non Af Amer: 81 mL/min/{1.73_m2} (ref >59)
Globulin, Total: 2.4 g/dL (ref 1.5–4.5)
Glucose: 95 mg/dL (ref 65–99)
Potassium: 4.7 mmol/L (ref 3.5–5.2)
Sodium: 138 mmol/L (ref 134–144)
Total Protein: 6.9 g/dL (ref 6.0–8.5)

## 2019-06-27 LAB — CBC WITH DIFFERENTIAL/PLATELET
Basophils Absolute: 0 10*3/uL (ref 0.0–0.2)
Basos: 1 %
EOS (ABSOLUTE): 0.2 10*3/uL (ref 0.0–0.4)
Eos: 5 %
Hematocrit: 45.3 % (ref 37.5–51.0)
Hemoglobin: 15.4 g/dL (ref 13.0–17.7)
Immature Grans (Abs): 0 10*3/uL (ref 0.0–0.1)
Immature Granulocytes: 0 %
Lymphocytes Absolute: 1.5 10*3/uL (ref 0.7–3.1)
Lymphs: 31 %
MCH: 31.1 pg (ref 26.6–33.0)
MCHC: 34 g/dL (ref 31.5–35.7)
MCV: 92 fL (ref 79–97)
Monocytes Absolute: 0.5 10*3/uL (ref 0.1–0.9)
Monocytes: 11 %
Neutrophils Absolute: 2.6 10*3/uL (ref 1.4–7.0)
Neutrophils: 52 %
Platelets: 206 10*3/uL (ref 150–450)
RBC: 4.95 x10E6/uL (ref 4.14–5.80)
RDW: 12.5 % (ref 11.6–15.4)
WBC: 4.9 10*3/uL (ref 3.4–10.8)

## 2019-06-27 LAB — HEPATITIS C ANTIBODY: Hep C Virus Ab: 0.2 s/co ratio (ref 0.0–0.9)

## 2019-06-27 LAB — LIPID PANEL
Chol/HDL Ratio: 3.4 ratio (ref 0.0–5.0)
Cholesterol, Total: 205 mg/dL — ABNORMAL HIGH (ref 100–199)
HDL: 61 mg/dL (ref >39)
LDL Chol Calc (NIH): 135 mg/dL — ABNORMAL HIGH (ref 0–99)
Triglycerides: 48 mg/dL (ref 0–149)
VLDL Cholesterol Cal: 9 mg/dL (ref 5–40)

## 2019-07-08 ENCOUNTER — Other Ambulatory Visit: Payer: Self-pay

## 2019-07-08 ENCOUNTER — Ambulatory Visit (HOSPITAL_COMMUNITY)
Admission: RE | Admit: 2019-07-08 | Discharge: 2019-07-08 | Disposition: A | Discharge status: Home / Self Care | Payer: Medicare Other | Source: Ambulatory Visit | Attending: Family Medicine | Admitting: Family Medicine

## 2019-07-08 DIAGNOSIS — Z136 Encounter for screening for cardiovascular disorders: Secondary | ICD-10-CM | POA: Diagnosis not present

## 2019-07-08 DIAGNOSIS — Z87891 Personal history of nicotine dependence: Secondary | ICD-10-CM | POA: Diagnosis not present

## 2019-07-08 DIAGNOSIS — Z0000 Encounter for general adult medical examination without abnormal findings: Secondary | ICD-10-CM | POA: Diagnosis present

## 2019-08-13 ENCOUNTER — Other Ambulatory Visit: Payer: Self-pay

## 2019-08-14 ENCOUNTER — Other Ambulatory Visit: Payer: Self-pay | Admitting: Nurse Practitioner

## 2019-08-14 ENCOUNTER — Encounter: Payer: Self-pay | Admitting: Nurse Practitioner

## 2019-08-14 ENCOUNTER — Ambulatory Visit (INDEPENDENT_AMBULATORY_CARE_PROVIDER_SITE_OTHER): Payer: Medicare Other | Admitting: Nurse Practitioner

## 2019-08-14 ENCOUNTER — Ambulatory Visit (INDEPENDENT_AMBULATORY_CARE_PROVIDER_SITE_OTHER): Payer: Medicare Other

## 2019-08-14 VITALS — BP 163/91 | HR 66 | Temp 96.9°F | Ht 67.5 in | Wt 179.8 lb

## 2019-08-14 DIAGNOSIS — M5431 Sciatica, right side: Secondary | ICD-10-CM

## 2019-08-14 DIAGNOSIS — M25551 Pain in right hip: Secondary | ICD-10-CM

## 2019-08-14 DIAGNOSIS — M25571 Pain in right ankle and joints of right foot: Secondary | ICD-10-CM

## 2019-08-14 MED ORDER — TRAMADOL HCL 50 MG PO TABS: 50.0000 mg | ORAL_TABLET | Freq: Three times a day (TID) | ORAL | 0 refills | Status: AC | PRN

## 2019-08-14 MED ORDER — PREDNISONE 10 MG (21) PO TBPK: ORAL_TABLET | ORAL | 0 refills | Status: DC

## 2019-08-14 NOTE — Progress Notes (Signed)
   Subjective:    Patient ID: Bradley Terry, male    DOB: 1956-07-31, 63 y.o.   MRN: [id number]   Chief Complaint: Hip Pain (Right hip pain runs down leg)   HPI Patient comes in today c/o right hip pain that radiates down leg. Pain increases when laying down. Rates pain 3-4/10 currently but has been as high as 7-8/10 the last 2 days. He has numbness and tingling going down leg. He has had back issues all of his life.    Review of Systems  Constitutional: Negative.   Respiratory: Negative.   Cardiovascular: Negative.   Musculoskeletal: Positive for arthralgias (right hip).  All other systems reviewed and are negative.      Objective:   Physical Exam Vitals signs and nursing note reviewed.  Constitutional:      Appearance: Normal appearance.  Cardiovascular:     Rate and Rhythm: Normal rate and regular rhythm.     Heart sounds: Normal heart sounds.  Pulmonary:     Effort: Pulmonary effort is normal.     Breath sounds: Normal breath sounds.  Musculoskeletal:     Comments: No pain on palpation of right hip. FROM of right hip with very minimal pain.   Skin:    General: Skin is warm.  Neurological:     General: No focal deficit present.     Mental Status: He is alert and oriented to person, place, and time.  Psychiatric:        Mood and Affect: Mood normal.        Behavior: Behavior normal.     BP (!) 163/91   Pulse 66   Temp (!) 96.9 F (36.1 C) (Temporal)   Ht 5' 7.5" (1.715 m)   Wt 179 lb 12.8 oz (81.6 kg)   SpO2 100%   BMI 27.75 kg/m        Assessment & Plan:  Bertis Ruddy in today with chief complaint of Hip Pain (Right hip pain runs down leg)   1. Sciatica of right side Moist heat Rest No bending or stooping Meds ordered this encounter  Medications  . predniSONE (STERAPRED UNI-PAK 21 TAB) 10 MG (21) TBPK tablet    Sig: As directed x 6 days    Dispense:  21 tablet    Refill:  0    Order Specific Question:   Supervising Provider    Answer:    Caryl Pina A [id number]  . traMADol (ULTRAM) 50 MG tablet    Sig: Take 1 tablet (50 mg total) by mouth every 8 (eight) hours as needed for up to 5 days.    Dispense:  15 tablet    Refill:  0    Order Specific Question:   Supervising Provider    Answer:   Worthy Rancher [id number]     [place], FNP

## 2019-08-14 NOTE — Patient Instructions (Signed)
Sciatica ° °Sciatica is pain, weakness, tingling, or loss of feeling (numbness) along the sciatic nerve. The sciatic nerve starts in the lower back and goes down the back of each leg. Sciatica usually goes away on its own or with treatment. Sometimes, sciatica may come back (recur). °What are the causes? °This condition happens when the sciatic nerve is pinched or has pressure put on it. This may be the result of: °· A disk in between the bones of the spine bulging out too far (herniated disk). °· Changes in the spinal disks that occur with aging. °· A condition that affects a muscle in the butt. °· Extra bone growth near the sciatic nerve. °· A break (fracture) of the area between your hip bones (pelvis). °· Pregnancy. °· Tumor. This is rare. °What increases the risk? °You are more likely to develop this condition if you: °· Play sports that put pressure or stress on the spine. °· Have poor strength and ease of movement (flexibility). °· Have had a back injury in the past. °· Have had back surgery. °· Sit for long periods of time. °· Do activities that involve bending or lifting over and over again. °· Are very overweight (obese). °What are the signs or symptoms? °Symptoms can vary from mild to very bad. They may include: °· Any of these problems in the lower back, leg, hip, or butt: °? Mild tingling, loss of feeling, or dull aches. °? Burning sensations. °? Sharp pains. °· Loss of feeling in the back of the calf or the sole of the foot. °· Leg weakness. °· Very bad back pain that makes it hard to move. °These symptoms may get worse when you cough, sneeze, or laugh. They may also get worse when you sit or stand for long periods of time. °How is this treated? °This condition often gets better without any treatment. However, treatment may include: °· Changing or cutting back on physical activity when you have pain. °· Doing exercises and stretching. °· Putting ice or heat on the affected area. °· Medicines that  help: °? To relieve pain and swelling. °? To relax your muscles. °· Shots (injections) of medicines that help to relieve pain, irritation, and swelling. °· Surgery. °Follow these instructions at home: °Medicines °· Take over-the-counter and prescription medicines only as told by your doctor. °· Ask your doctor if the medicine prescribed to you: °? Requires you to avoid driving or using heavy machinery. °? Can cause trouble pooping (constipation). You may need to take these steps to prevent or treat trouble pooping: °§ Drink enough fluids to keep your pee (urine) pale yellow. °§ Take over-the-counter or prescription medicines. °§ Eat foods that are high in fiber. These include beans, whole grains, and fresh fruits and vegetables. °§ Limit foods that are high in fat and sugar. These include fried or sweet foods. °Managing pain ° °  ° °· If told, put ice on the affected area. °? Put ice in a plastic bag. °? Place a towel between your skin and the bag. °? Leave the ice on for 20 minutes, 2-3 times a day. °· If told, put heat on the affected area. Use the heat source that your doctor tells you to use, such as a moist heat pack or a heating pad. °? Place a towel between your skin and the heat source. °? Leave the heat on for 20-30 minutes. °? Remove the heat if your skin turns bright red. This is very important if you are   unable to feel pain, heat, or cold. You may have a greater risk of getting burned. °Activity ° °· Return to your normal activities as told by your doctor. Ask your doctor what activities are safe for you. °· Avoid activities that make your symptoms worse. °· Take short rests during the day. °? When you rest for a long time, do some physical activity or stretching between periods of rest. °? Avoid sitting for a long time without moving. Get up and move around at least one time each hour. °· Exercise and stretch regularly, as told by your doctor. °· Do not lift anything that is heavier than 10 lb (4.5 kg)  while you have symptoms of sciatica. °? Avoid lifting heavy things even when you do not have symptoms. °? Avoid lifting heavy things over and over. °· When you lift objects, always lift in a way that is safe for your body. To do this, you should: °? Bend your knees. °? Keep the object close to your body. °? Avoid twisting. °General instructions °· Stay at a healthy weight. °· Wear comfortable shoes that support your feet. Avoid wearing high heels. °· Avoid sleeping on a mattress that is too soft or too hard. You might have less pain if you sleep on a mattress that is firm enough to support your back. °· Keep all follow-up visits as told by your doctor. This is important. °Contact a doctor if: °· You have pain that: °? Wakes you up when you are sleeping. °? Gets worse when you lie down. °? Is worse than the pain you have had in the past. °? Lasts longer than 4 weeks. °· You lose weight without trying. °Get help right away if: °· You cannot control when you pee (urinate) or poop (have a bowel movement). °· You have weakness in any of these areas and it gets worse: °? Lower back. °? The area between your hip bones. °? Butt. °? Legs. °· You have redness or swelling of your back. °· You have a burning feeling when you pee. °Summary °· Sciatica is pain, weakness, tingling, or loss of feeling (numbness) along the sciatic nerve. °· This condition happens when the sciatic nerve is pinched or has pressure put on it. °· Sciatica can cause pain, tingling, or loss of feeling (numbness) in the lower back, legs, hips, and butt. °· Treatment often includes rest, exercise, medicines, and putting ice or heat on the affected area. °This information is not intended to replace advice given to you by your health care provider. Make sure you discuss any questions you have with your health care provider. °Document Released: 06/27/2008 Document Revised: 10/07/2018 Document Reviewed: 10/07/2018 °Elsevier Patient Education © 2020 [employer]  [employer] ° °

## 2020-01-11 IMAGING — DX DG HIP (WITH OR WITHOUT PELVIS) 2-3V*R*
3 series · 3 of 3 positions shown · non-contrast
Comparison: No recent prior.

CLINICAL DATA: Right hip pain.

EXAM:
DG HIP (WITH OR WITHOUT PELVIS) 2-3V RIGHT

[pelvis ap]
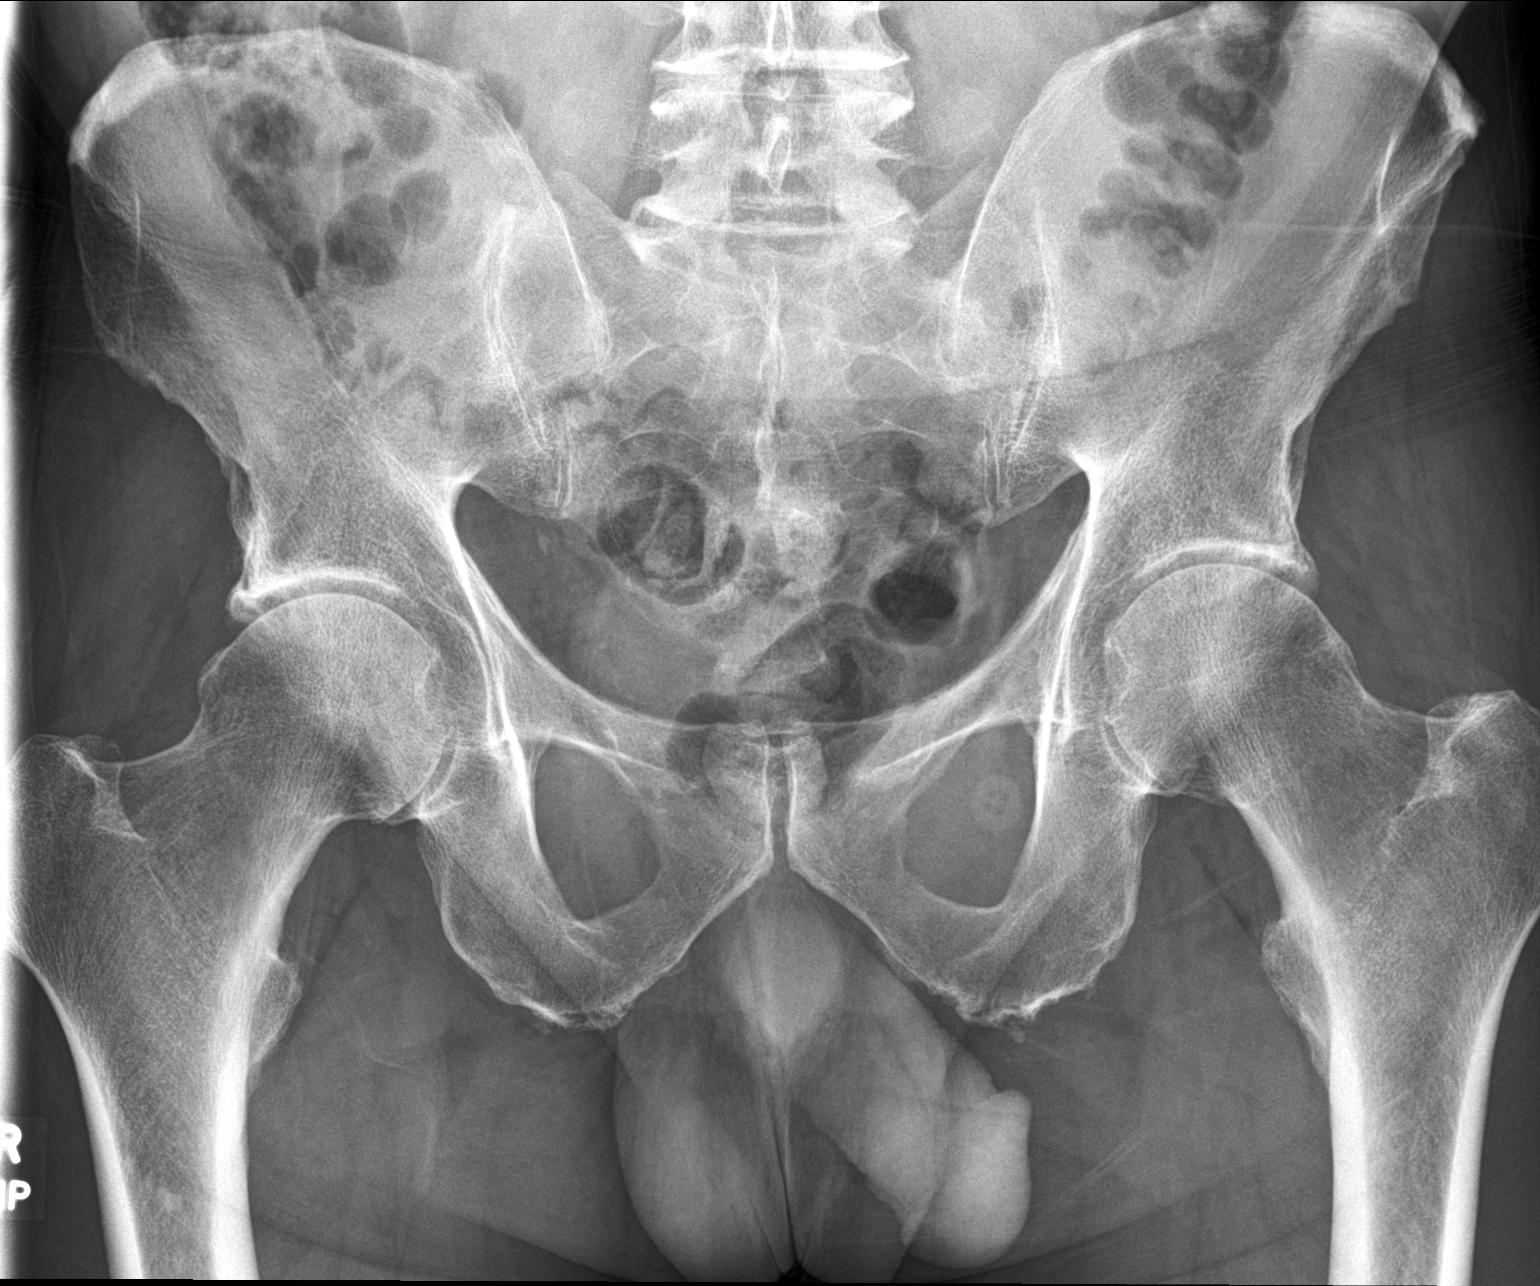

[hip ap]
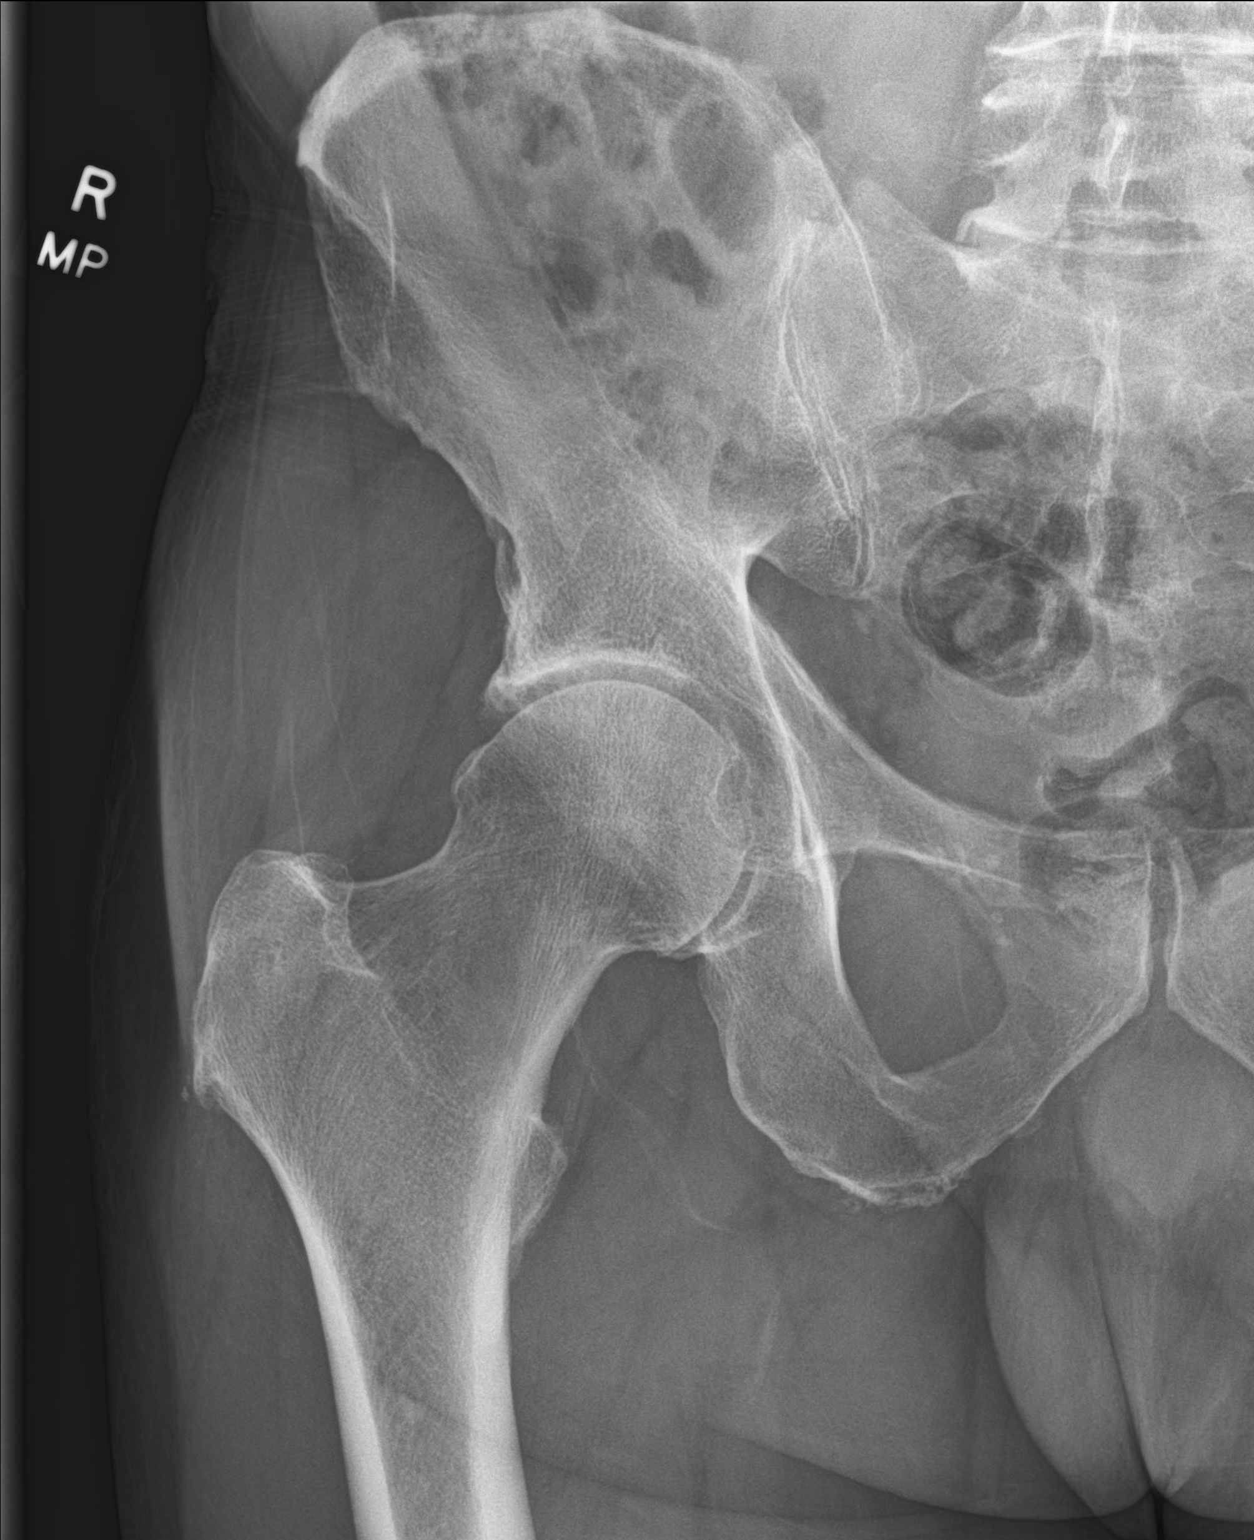

[hip lat]
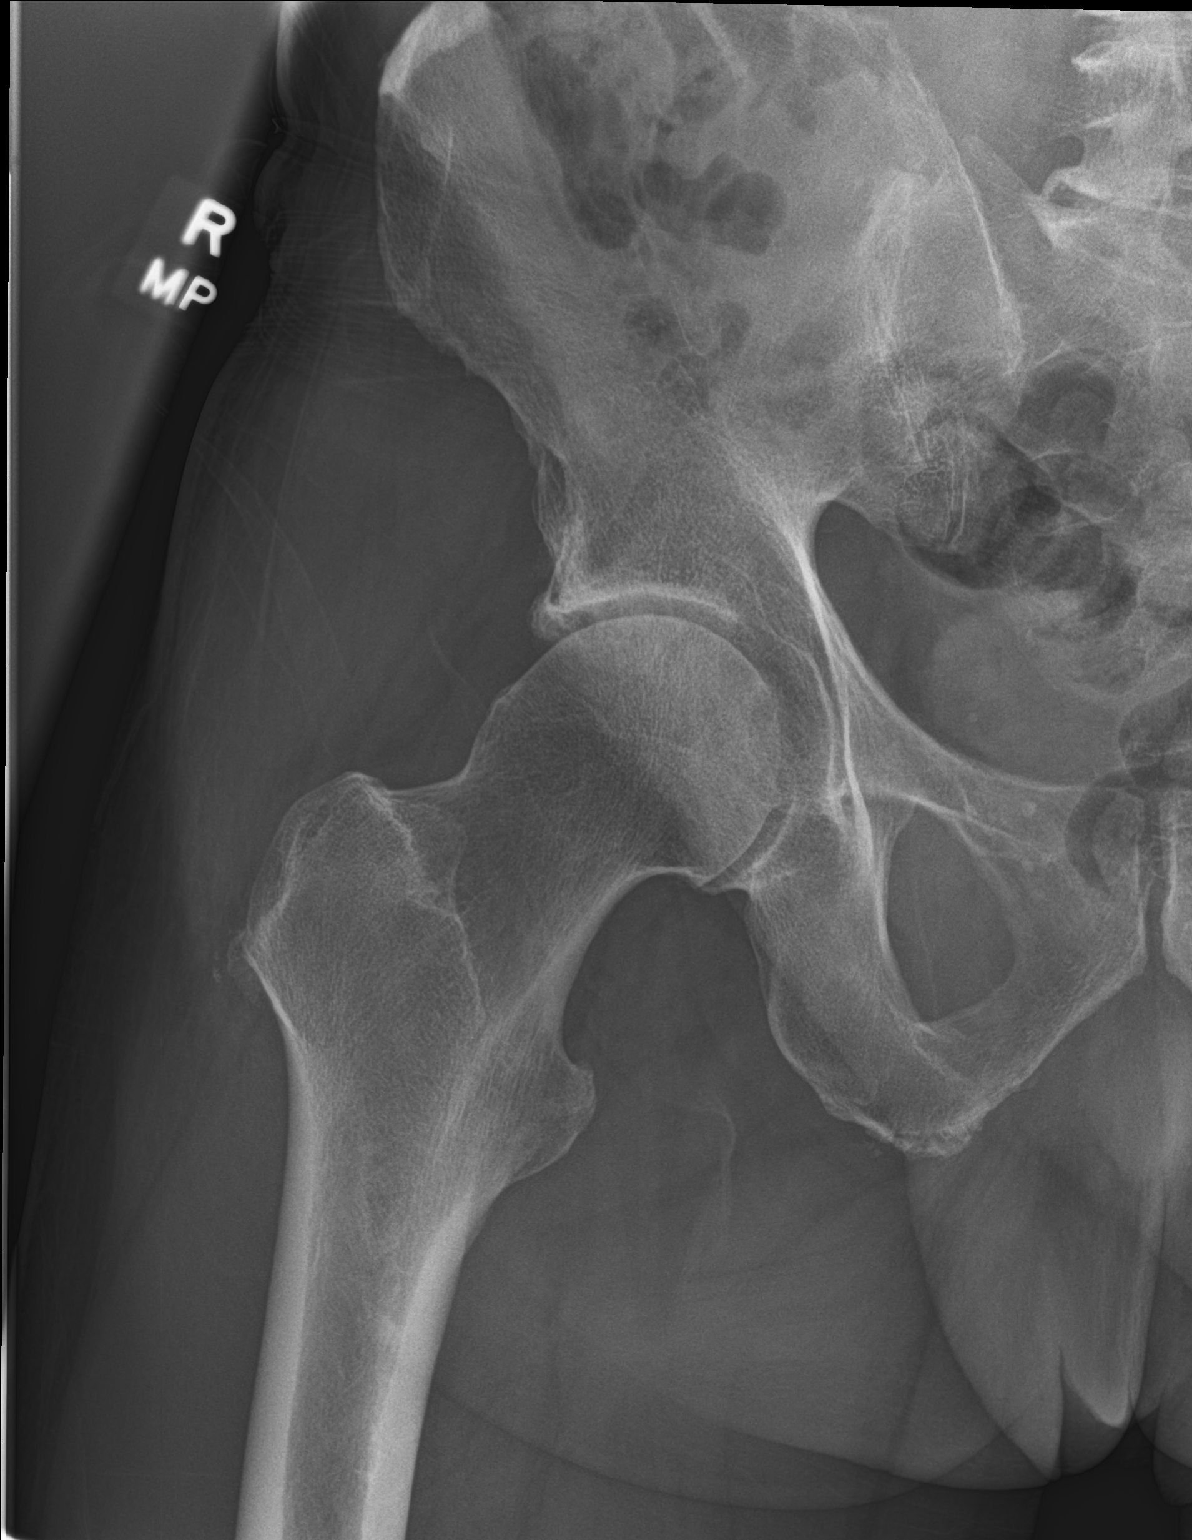

[3 of 3 positions shown; findings below may reference images not displayed]

FINDINGS: Degenerative changes lumbar spine and both hips. No acute bony or
joint abnormality identified. No evidence of fracture or
dislocation. Pelvic calcifications consistent phleboliths.
IMPRESSION: Degenerative change lumbar spine and both hips. No acute abnormality
identified.

## 2021-02-19 DIAGNOSIS — Z20822 Contact with and (suspected) exposure to covid-19: Secondary | ICD-10-CM | POA: Diagnosis not present

## 2021-04-26 ENCOUNTER — Other Ambulatory Visit: Payer: Self-pay

## 2021-04-26 ENCOUNTER — Encounter: Payer: Self-pay | Admitting: Family Medicine

## 2021-04-26 ENCOUNTER — Ambulatory Visit (INDEPENDENT_AMBULATORY_CARE_PROVIDER_SITE_OTHER): Payer: Medicare Other | Admitting: Family Medicine

## 2021-04-26 VITALS — BP 155/92 | HR 73 | Temp 97.8°F | Ht 67.5 in | Wt 182.2 lb

## 2021-04-26 DIAGNOSIS — D485 Neoplasm of uncertain behavior of skin: Secondary | ICD-10-CM

## 2021-04-26 NOTE — Progress Notes (Signed)
No chief complaint on file.   HPI  Patient presents today for lesion on scalp just above hairline. Concerned for Ca. Wants it removed. Needs referral.   PMH: Smoking status noted ROS: Per HPI  Objective: BP (!) 155/92   Pulse 73   Temp 97.8 F (36.6 C)   Ht 5' 7.5" (1.715 m)   Wt 182 lb 3.2 oz (82.6 kg)   SpO2 99%   BMI 28.12 kg/m  Gen: NAD, alert, cooperative with exam HEENT: NCAT, EOMI, PERRL 1.3 CM lesion rough texture and pale, circumscribed, just above frontal hairline. Neuro: Alert and oriented, No gross deficits  Assessment and plan:   1. Neoplasm of uncertain behavior of skin       Orders Placed This Encounter  Procedures   Ambulatory referral to Dermatology    Referral Priority:   Routine    Referral Type:   Consultation    Referral Reason:   Specialty Services Required    Requested Specialty:   Dermatology    Number of Visits Requested:   1    Follow up as needed.  Claretta Fraise, MD

## 2021-04-28 ENCOUNTER — Telehealth: Payer: Self-pay | Admitting: Family Medicine

## 2021-05-02 ENCOUNTER — Telehealth: Payer: Self-pay | Admitting: Family Medicine

## 2021-05-10 DIAGNOSIS — B078 Other viral warts: Secondary | ICD-10-CM | POA: Diagnosis not present

## 2021-05-10 DIAGNOSIS — L820 Inflamed seborrheic keratosis: Secondary | ICD-10-CM | POA: Diagnosis not present

## 2021-05-10 DIAGNOSIS — L570 Actinic keratosis: Secondary | ICD-10-CM | POA: Diagnosis not present

## 2021-05-10 DIAGNOSIS — X32XXXA Exposure to sunlight, initial encounter: Secondary | ICD-10-CM | POA: Diagnosis not present

## 2021-08-01 ENCOUNTER — Telehealth: Payer: Self-pay | Admitting: Family Medicine

## 2021-08-01 NOTE — Telephone Encounter (Signed)
  Left message for patient to call back and schedule Medicare Annual Wellness Visit (AWV) to be completed by video or phone.  eligible for AWVI as of 06/26/2020  Please schedule at anytime with [facility] Health Advisor.      59 Minutes appointment   Any questions, please call me at [phone]

## 2021-09-06 ENCOUNTER — Encounter: Payer: Medicare Other | Admitting: Family Medicine

## 2021-09-12 ENCOUNTER — Encounter: Payer: Self-pay | Admitting: Family Medicine

## 2021-09-12 ENCOUNTER — Ambulatory Visit (INDEPENDENT_AMBULATORY_CARE_PROVIDER_SITE_OTHER): Payer: Medicare Other | Admitting: Family Medicine

## 2021-09-12 VITALS — BP 141/81 | HR 74 | Temp 97.1°F | Ht 67.5 in | Wt 183.4 lb

## 2021-09-12 DIAGNOSIS — Z0000 Encounter for general adult medical examination without abnormal findings: Secondary | ICD-10-CM | POA: Diagnosis not present

## 2021-09-12 DIAGNOSIS — Z125 Encounter for screening for malignant neoplasm of prostate: Secondary | ICD-10-CM | POA: Diagnosis not present

## 2021-09-12 DIAGNOSIS — Z136 Encounter for screening for cardiovascular disorders: Secondary | ICD-10-CM | POA: Diagnosis not present

## 2021-09-12 DIAGNOSIS — Z13220 Encounter for screening for lipoid disorders: Secondary | ICD-10-CM

## 2021-09-12 DIAGNOSIS — N401 Enlarged prostate with lower urinary tract symptoms: Secondary | ICD-10-CM

## 2021-09-12 DIAGNOSIS — R351 Nocturia: Secondary | ICD-10-CM

## 2021-09-12 DIAGNOSIS — Z0001 Encounter for general adult medical examination with abnormal findings: Secondary | ICD-10-CM

## 2021-09-12 DIAGNOSIS — Z1211 Encounter for screening for malignant neoplasm of colon: Secondary | ICD-10-CM | POA: Diagnosis not present

## 2021-09-12 DIAGNOSIS — Z23 Encounter for immunization: Secondary | ICD-10-CM | POA: Diagnosis not present

## 2021-09-12 MED ORDER — XIIDRA 5 % OP SOLN: 1.0000 [drp] | Freq: Two times a day (BID) | OPHTHALMIC | 5 refills | Status: DC

## 2021-09-12 NOTE — Progress Notes (Signed)
Annual   Subjective:  Patient ID: Bradley Terry, male    DOB: Oct 17, 1955  Age: 65 y.o. MRN: [id number]  CC: Annual Exam   HPI Bradley Terry presents for annual physical  Depression screen [facility] 2/9 09/12/2021 04/26/2021 08/14/2019  Decreased Interest 0 0 0  Down, Depressed, Hopeless 0 0 0  PHQ - 2 Score 0 0 0    History Bradley Terry has a past medical history of Colon polyps.   Bradley Terry has a past surgical history that includes Spine surgery (1982, 1986).   His family history includes ALS in his father; COPD in his mother.Bradley Terry reports that Bradley Terry has never smoked. Bradley Terry has never used smokeless tobacco. Bradley Terry reports that Bradley Terry does not drink alcohol and does not use drugs.    ROS Review of Systems  Constitutional:  Negative for activity change, fatigue and unexpected weight change.  HENT:  Negative for congestion, ear pain, hearing loss, postnasal drip and trouble swallowing.   Eyes:  Positive for photophobia. Negative for pain and visual disturbance.  Respiratory:  Negative for cough, chest tightness and shortness of breath.   Cardiovascular:  Negative for chest pain, palpitations and leg swelling.  Gastrointestinal:  Negative for abdominal distention, abdominal pain, blood in stool, constipation, diarrhea, nausea and vomiting.  Endocrine: Negative for cold intolerance, heat intolerance and polydipsia.  Genitourinary:  Positive for frequency (Nocturia once a night). Negative for difficulty urinating, dysuria, flank pain and urgency.  Musculoskeletal:  Negative for arthralgias and joint swelling.  Skin:  Negative for color change, rash and wound.  Neurological:  Negative for dizziness, syncope, speech difficulty, weakness, light-headedness, numbness and headaches.  Hematological:  Does not bruise/bleed easily.  Psychiatric/Behavioral:  Negative for confusion, decreased concentration, dysphoric mood and sleep disturbance. The patient is not nervous/anxious.    Objective:  BP (!) 141/81   Pulse 74    Temp (!) 97.1 F (36.2 C)   Ht 5' 7.5" (1.715 m)   Wt 183 lb 6.4 oz (83.2 kg)   SpO2 98%   BMI 28.30 kg/m   BP Readings from Last 3 Encounters:  09/12/21 (!) 141/81  04/26/21 (!) 155/92  08/14/19 (!) 163/91    Wt Readings from Last 3 Encounters:  09/12/21 183 lb 6.4 oz (83.2 kg)  04/26/21 182 lb 3.2 oz (82.6 kg)  08/14/19 179 lb 12.8 oz (81.6 kg)     Physical Exam Constitutional:      General: Bradley Terry is not in acute distress.    Appearance: Normal appearance. Bradley Terry is well-developed. Bradley Terry is not ill-appearing.  HENT:     Head: Normocephalic and atraumatic.  Eyes:     Pupils: Pupils are equal, round, and reactive to light.  Neck:     Thyroid: No thyromegaly.     Trachea: No tracheal deviation.  Cardiovascular:     Rate and Rhythm: Normal rate and regular rhythm.     Heart sounds: Normal heart sounds. No murmur heard.   No friction rub. No gallop.  Pulmonary:     Breath sounds: Normal breath sounds. No wheezing or rales.  Abdominal:     General: Bowel sounds are normal. There is no distension.     Palpations: Abdomen is soft. There is no mass.     Tenderness: There is no abdominal tenderness.     Hernia: There is no hernia in the left inguinal area.  Genitourinary:    Penis: Normal.      Testes: Normal.  Musculoskeletal:  General: Normal range of motion.     Cervical back: Normal range of motion.  Lymphadenopathy:     Cervical: No cervical adenopathy.  Skin:    General: Skin is warm and dry.  Neurological:     Mental Status: Bradley Terry is alert and oriented to person, place, and time.      Assessment & Plan:   Bradley Terry was seen today for annual exam.  Diagnoses and all orders for this visit:  Well adult exam -     CBC with Differential/Platelet -     CMP14+EGFR -     Lipid panel -     PSA, total and free  Lipid screening -     Lipid panel  Prostate cancer screening -     PSA, total and free  Benign prostatic hyperplasia with nocturia      Bradley Terry  R. Bradley Terry does not currently have medications on file.  Allergies as of 09/12/2021   No Known Allergies      Medication List    as of September 12, 2021 10:18 AM   You have not been prescribed any medications.      Follow-up: Return in about 1 year (around 09/12/2022).  Claretta Fraise, M.D.

## 2021-09-13 LAB — CBC WITH DIFFERENTIAL/PLATELET
Basophils Absolute: 0 10*3/uL (ref 0.0–0.2)
Basos: 1 %
EOS (ABSOLUTE): 0.2 10*3/uL (ref 0.0–0.4)
Eos: 3 %
Hematocrit: 46.8 % (ref 37.5–51.0)
Hemoglobin: 15.8 g/dL (ref 13.0–17.7)
Immature Grans (Abs): 0 10*3/uL (ref 0.0–0.1)
Immature Granulocytes: 1 %
Lymphocytes Absolute: 1.5 10*3/uL (ref 0.7–3.1)
Lymphs: 26 %
MCH: 30.2 pg (ref 26.6–33.0)
MCHC: 33.8 g/dL (ref 31.5–35.7)
MCV: 90 fL (ref 79–97)
Monocytes Absolute: 0.6 10*3/uL (ref 0.1–0.9)
Monocytes: 10 %
Neutrophils Absolute: 3.4 10*3/uL (ref 1.4–7.0)
Neutrophils: 59 %
Platelets: 225 10*3/uL (ref 150–450)
RBC: 5.23 x10E6/uL (ref 4.14–5.80)
RDW: 12.5 % (ref 11.6–15.4)
WBC: 5.7 10*3/uL (ref 3.4–10.8)

## 2021-09-13 LAB — CMP14+EGFR
ALT: 20 IU/L (ref 0–44)
AST: 17 IU/L (ref 0–40)
Albumin/Globulin Ratio: 2.1 (ref 1.2–2.2)
Albumin: 4.6 g/dL (ref 3.8–4.8)
Alkaline Phosphatase: 60 IU/L (ref 44–121)
BUN/Creatinine Ratio: 11 (ref 10–24)
BUN: 10 mg/dL (ref 8–27)
Bilirubin Total: 0.4 mg/dL (ref 0.0–1.2)
CO2: 24 mmol/L (ref 20–29)
Calcium: 9.3 mg/dL (ref 8.6–10.2)
Chloride: 101 mmol/L (ref 96–106)
Creatinine, Ser: 0.91 mg/dL (ref 0.76–1.27)
Globulin, Total: 2.2 g/dL (ref 1.5–4.5)
Glucose: 93 mg/dL (ref 70–99)
Potassium: 4.6 mmol/L (ref 3.5–5.2)
Sodium: 137 mmol/L (ref 134–144)
Total Protein: 6.8 g/dL (ref 6.0–8.5)
eGFR: 92 mL/min/{1.73_m2} (ref >59)

## 2021-09-13 LAB — LIPID PANEL
Chol/HDL Ratio: 3.4 ratio (ref 0.0–5.0)
Cholesterol, Total: 225 mg/dL — ABNORMAL HIGH (ref 100–199)
HDL: 67 mg/dL (ref >39)
LDL Chol Calc (NIH): 142 mg/dL — ABNORMAL HIGH (ref 0–99)
Triglycerides: 93 mg/dL (ref 0–149)
VLDL Cholesterol Cal: 16 mg/dL (ref 5–40)

## 2021-09-13 LAB — PSA, TOTAL AND FREE
PSA, Free Pct: 17.4 %
PSA, Free: 0.47 ng/mL
Prostate Specific Ag, Serum: 2.7 ng/mL (ref 0.0–4.0)

## 2021-09-13 NOTE — Progress Notes (Signed)
Hello Bradley Terry,  Your lab result is normal and/or stable.Some minor variations that are not significant are commonly marked abnormal, but do not represent any medical problem for you.  Best regards, Claretta Fraise, M.D.

## 2021-09-28 ENCOUNTER — Telehealth: Payer: Self-pay | Admitting: Family Medicine

## 2021-09-28 NOTE — Telephone Encounter (Signed)
No answer unable to leave a message for patient to call back and schedule Medicare Annual Wellness Visit (AWV) to be completed by video or phone.   Last AWV: 06/26/2019  Please schedule at anytime with [place]  45 minute appointment  Any questions, please contact me at [phone]

## 2021-10-26 ENCOUNTER — Telehealth: Payer: Self-pay | Admitting: Family Medicine

## 2021-10-26 NOTE — Telephone Encounter (Signed)
Left message for patient to call back and schedule Medicare Annual Wellness Visit (AWV) to be completed by video or phone.   Last AWV: 06/26/2019 Please schedule at anytime with [place]  45 minute appointment  Any questions, please contact me at [phone]

## 2021-11-30 ENCOUNTER — Telehealth: Payer: Self-pay | Admitting: Family Medicine

## 2021-11-30 NOTE — Telephone Encounter (Signed)
Left message for patient to call back and schedule Medicare Annual Wellness Visit (AWV) to be completed by video or phone.  ? ?Last AWV: 06/26/2019 ? ?Please schedule at anytime with [place] ? ?45 minute appointment ? ?Any questions, please contact me at [phone]  ?  ?  ?

## 2022-07-03 DIAGNOSIS — S61211A Laceration without foreign body of left index finger without damage to nail, initial encounter: Secondary | ICD-10-CM | POA: Diagnosis not present

## 2022-07-03 DIAGNOSIS — W458XXA Other foreign body or object entering through skin, initial encounter: Secondary | ICD-10-CM | POA: Diagnosis not present

## 2022-07-03 DIAGNOSIS — S61213A Laceration without foreign body of left middle finger without damage to nail, initial encounter: Secondary | ICD-10-CM | POA: Diagnosis not present

## 2022-08-03 DIAGNOSIS — M79645 Pain in left finger(s): Secondary | ICD-10-CM | POA: Insufficient documentation

## 2022-09-13 ENCOUNTER — Encounter: Payer: Medicare Other | Admitting: Family Medicine

## 2023-07-05 ENCOUNTER — Encounter: Payer: Self-pay | Admitting: Family Medicine

## 2023-07-05 ENCOUNTER — Ambulatory Visit: Payer: Medicare Other | Admitting: Family Medicine

## 2023-07-05 VITALS — BP 175/97 | HR 76 | Temp 98.3°F | Ht 67.5 in | Wt 182.2 lb

## 2023-07-05 DIAGNOSIS — R030 Elevated blood-pressure reading, without diagnosis of hypertension: Secondary | ICD-10-CM | POA: Diagnosis not present

## 2023-07-05 DIAGNOSIS — G8929 Other chronic pain: Secondary | ICD-10-CM | POA: Diagnosis not present

## 2023-07-05 DIAGNOSIS — M545 Low back pain, unspecified: Secondary | ICD-10-CM

## 2023-07-05 MED ORDER — PREDNISONE 20 MG PO TABS
40.0000 mg | ORAL_TABLET | Freq: Every day | ORAL | 0 refills | Status: AC
Start: 2023-07-05 — End: 2023-07-10

## 2023-07-05 NOTE — Patient Instructions (Signed)

## 2023-07-05 NOTE — Progress Notes (Signed)
Subjective:  Patient ID: Bradley Terry, male    DOB: 03/11/1956, 67 y.o.   MRN: [id number]  Patient Care Team: Mechele Claude, MD as PCP - General (Family Medicine)   Chief Complaint:  Back Pain (Right lower back pain that has been on and off for 40 years.  States it started back up Monday. )   HPI: Bradley Terry is a 67 y.o. male presenting on 07/05/2023 for Back Pain (Right lower back pain that has been on and off for 40 years.  States it started back up Monday. )    1. Chronic bilateral low back pain without sciatica Pt presents today with worsening lower back pain over the last few days. States he has been sitting for long periods of time over the last several days and feels this made the pain worse. He denies saddle anesthesia, radiation into legs, loss of function, or loss of bowel or bladder. Has taken some motrin with minimal relief. No new injuries. Has been ongoing for several years.     Relevant past medical, surgical, family, and social history reviewed and updated as indicated.  Allergies and medications reviewed and updated. Data reviewed: Chart in Epic.   Past Medical History:  Diagnosis Date   Colon polyps     Past Surgical History:  Procedure Laterality Date   SPINE SURGERY  1982, 1986   lumbar surgery    Social History   Socioeconomic History   Marital status: Married    Spouse name: Not on file   Number of children: 2   Years of education: Not on file   Highest education level: Not on file  Occupational History   Not on file  Tobacco Use   Smoking status: Never   Smokeless tobacco: Never  Substance and Sexual Activity   Alcohol use: No   Drug use: No   Sexual activity: Yes    Birth control/protection: Post-menopausal  Other Topics Concern   Not on file  Social History Narrative   Not on file   Social Determinants of Health   Financial Resource Strain: Low Risk  (06/26/2019)   Overall Financial Resource Strain (CARDIA)     Difficulty of Paying Living Expenses: Not very hard  Food Insecurity: Unknown (06/26/2019)   Hunger Vital Sign    Worried About Running Out of Food in the Last Year: Not on file    Ran Out of Food in the Last Year: Never true  Transportation Needs: No Transportation Needs (06/26/2019)   PRAPARE - Administrator, Civil Service (Medical): No    Lack of Transportation (Non-Medical): No  Physical Activity: Sufficiently Active (06/26/2019)   Exercise Vital Sign    Days of Exercise per Week: 5 days    Minutes of Exercise per Session: 60 min  Stress: No Stress Concern Present (06/26/2019)   [employer] of Occupational Health - Occupational Stress Questionnaire    Feeling of Stress : Only a little  Social Connections: Not on file  Intimate Partner Violence: Not on file    Outpatient Encounter Medications as of 07/05/2023  Medication Sig   predniSONE (DELTASONE) 20 MG tablet Take 2 tablets (40 mg total) by mouth daily with breakfast for 5 days. 2 po daily for 5 days   [DISCONTINUED] Lifitegrast (XIIDRA) 5 % SOLN Apply 1 drop to eye 2 (two) times daily.   No facility-administered encounter medications on file as of 07/05/2023.    Allergies  Allergen Reactions  Codeine Nausea And Vomiting    Review of Systems  Musculoskeletal:  Positive for arthralgias and back pain. Negative for gait problem, joint swelling, myalgias, neck pain and neck stiffness.  All other systems reviewed and are negative.       Objective:  BP (!) 175/97   Pulse 76   Temp 98.3 F (36.8 C) (Temporal)   Ht 5' 7.5" (1.715 m)   Wt 182 lb 3.2 oz (82.6 kg)   SpO2 97%   BMI 28.12 kg/m    Wt Readings from Last 3 Encounters:  07/05/23 182 lb 3.2 oz (82.6 kg)  09/12/21 183 lb 6.4 oz (83.2 kg)  04/26/21 182 lb 3.2 oz (82.6 kg)    Physical Exam Vitals and nursing note reviewed.  Constitutional:      General: He is not in acute distress.    Appearance: Normal appearance. He is not ill-appearing,  toxic-appearing or diaphoretic.  HENT:     Head: Normocephalic and atraumatic.     Nose: Nose normal.     Mouth/Throat:     Mouth: Mucous membranes are moist.  Eyes:     Pupils: Pupils are equal, round, and reactive to light.  Cardiovascular:     Rate and Rhythm: Normal rate and regular rhythm.     Heart sounds: Normal heart sounds.  Pulmonary:     Effort: Pulmonary effort is normal.     Breath sounds: Normal breath sounds.  Musculoskeletal:     Thoracic back: Normal.     Lumbar back: Tenderness present. No swelling, edema, deformity, signs of trauma, lacerations, spasms or bony tenderness. Decreased range of motion. Negative right straight leg raise test and negative left straight leg raise test. No scoliosis.     Right hip: Normal.     Left hip: Normal.     Right lower leg: No edema.     Left lower leg: No edema.  Skin:    General: Skin is warm and dry.     Capillary Refill: Capillary refill takes less than 2 seconds.  Neurological:     General: No focal deficit present.     Mental Status: He is alert and oriented to person, place, and time.  Psychiatric:        Mood and Affect: Mood normal.        Behavior: Behavior normal.        Thought Content: Thought content normal.        Judgment: Judgment normal.     Results for orders placed or performed in visit on 09/12/21  CBC with Differential/Platelet  Result Value Ref Range   WBC 5.7 3.4 - 10.8 x10E3/uL   RBC 5.23 4.14 - 5.80 x10E6/uL   Hemoglobin 15.8 13.0 - 17.7 g/dL   Hematocrit 40.9 81.1 - 51.0 %   MCV 90 79 - 97 fL   MCH 30.2 26.6 - 33.0 pg   MCHC 33.8 31.5 - 35.7 g/dL   RDW 91.4 78.2 - 95.6 %   Platelets 225 150 - 450 x10E3/uL   Neutrophils 59 Not Estab. %   Lymphs 26 Not Estab. %   Monocytes 10 Not Estab. %   Eos 3 Not Estab. %   Basos 1 Not Estab. %   Neutrophils Absolute 3.4 1.4 - 7.0 x10E3/uL   Lymphocytes Absolute 1.5 0.7 - 3.1 x10E3/uL   Monocytes Absolute 0.6 0.1 - 0.9 x10E3/uL   EOS (ABSOLUTE) 0.2  0.0 - 0.4 x10E3/uL   Basophils Absolute 0.0 0.0 - 0.2 x10E3/uL  Immature Granulocytes 1 Not Estab. %   Immature Grans (Abs) 0.0 0.0 - 0.1 x10E3/uL  CMP14+EGFR  Result Value Ref Range   Glucose 93 70 - 99 mg/dL   BUN 10 8 - 27 mg/dL   Creatinine, Ser 9.14 0.76 - 1.27 mg/dL   eGFR 92 >78 GN/FAO/1.30   BUN/Creatinine Ratio 11 10 - 24   Sodium 137 134 - 144 mmol/L   Potassium 4.6 3.5 - 5.2 mmol/L   Chloride 101 96 - 106 mmol/L   CO2 24 20 - 29 mmol/L   Calcium 9.3 8.6 - 10.2 mg/dL   Total Protein 6.8 6.0 - 8.5 g/dL   Albumin 4.6 3.8 - 4.8 g/dL   Globulin, Total 2.2 1.5 - 4.5 g/dL   Albumin/Globulin Ratio 2.1 1.2 - 2.2   Bilirubin Total 0.4 0.0 - 1.2 mg/dL   Alkaline Phosphatase 60 44 - 121 IU/L   AST 17 0 - 40 IU/L   ALT 20 0 - 44 IU/L  Lipid panel  Result Value Ref Range   Cholesterol, Total 225 (H) 100 - 199 mg/dL   Triglycerides 93 0 - 149 mg/dL   HDL 67 >86 mg/dL   VLDL Cholesterol Cal 16 5 - 40 mg/dL   LDL Chol Calc (NIH) 578 (H) 0 - 99 mg/dL   Chol/HDL Ratio 3.4 0.0 - 5.0 ratio  PSA, total and free  Result Value Ref Range   Prostate Specific Ag, Serum 2.7 0.0 - 4.0 ng/mL   PSA, Free 0.47 N/A ng/mL   PSA, Free Pct 17.4 %       Pertinent labs & imaging results that were available during my care of the patient were reviewed by me and considered in my medical decision making.  Assessment & Plan:  Rasheed was seen today for back pain.  Diagnoses and all orders for this visit:  Chronic bilateral low back pain without sciatica No red flags concerning for cauda equina syndrome. Will burst with steroids to see if beneficial. Aware to report new, worsening, or persistent symptoms.  -     predniSONE (DELTASONE) 20 MG tablet; Take 2 tablets (40 mg total) by mouth daily with breakfast for 5 days. 2 po daily for 5 days  Elevated blood pressure reading without diagnosis of hypertension DASH diet discussed in detail. Aware to monitor BP over next 4-6 weeks and then return to  clinic for follow up. Aware of red flags which require emergent evaluation and treatment.     Continue all other maintenance medications.  Follow up plan: Return in about 6 weeks (around 08/16/2023), or if symptoms worsen or fail to improve, for HTN.   Continue healthy lifestyle choices, including diet (rich in fruits, vegetables, and lean proteins, and low in salt and simple carbohydrates) and exercise (at least 30 minutes of moderate physical activity daily).  Educational handout given for DASH diet, LBP rehab  The above assessment and management plan was discussed with the patient. The patient verbalized understanding of and has agreed to the management plan. Patient is aware to call the clinic if they develop any new symptoms or if symptoms persist or worsen. Patient is aware when to return to the clinic for a follow-up visit. Patient educated on when it is appropriate to go to the emergency department.   Kari Baars, FNP-C [facility] [phone]

## 2023-08-16 ENCOUNTER — Ambulatory Visit: Payer: Medicare Other | Admitting: Family Medicine

## 2023-08-20 ENCOUNTER — Encounter: Payer: Medicare Other | Admitting: Family

## 2023-08-20 ENCOUNTER — Ambulatory Visit: Payer: Medicare Other | Admitting: Family

## 2023-09-04 ENCOUNTER — Ambulatory Visit (INDEPENDENT_AMBULATORY_CARE_PROVIDER_SITE_OTHER): Payer: Medicare Other | Admitting: Family Medicine

## 2023-09-04 ENCOUNTER — Encounter: Payer: Self-pay | Admitting: Family Medicine

## 2023-09-04 VITALS — BP 136/82 | HR 66 | Temp 97.4°F | Ht 67.5 in | Wt 186.4 lb

## 2023-09-04 DIAGNOSIS — N401 Enlarged prostate with lower urinary tract symptoms: Secondary | ICD-10-CM | POA: Diagnosis not present

## 2023-09-04 DIAGNOSIS — I10 Essential (primary) hypertension: Secondary | ICD-10-CM | POA: Diagnosis not present

## 2023-09-04 DIAGNOSIS — G8929 Other chronic pain: Secondary | ICD-10-CM

## 2023-09-04 DIAGNOSIS — Z23 Encounter for immunization: Secondary | ICD-10-CM | POA: Diagnosis not present

## 2023-09-04 DIAGNOSIS — E559 Vitamin D deficiency, unspecified: Secondary | ICD-10-CM

## 2023-09-04 DIAGNOSIS — R351 Nocturia: Secondary | ICD-10-CM

## 2023-09-04 DIAGNOSIS — M545 Low back pain, unspecified: Secondary | ICD-10-CM | POA: Diagnosis not present

## 2023-09-04 MED ORDER — LISINOPRIL 10 MG PO TABS: 10.0000 mg | ORAL_TABLET | Freq: Every day | ORAL | 3 refills | Status: DC

## 2023-09-04 NOTE — Addendum Note (Signed)
Addended by: Adella Hare B on: 09/04/2023 04:44 PM   Modules accepted: Orders

## 2023-09-04 NOTE — Progress Notes (Signed)
Subjective:  Patient ID: Bradley Terry, male    DOB: 1955/10/30  Age: 67 y.o. MRN: [id number]  CC: Follow-up   HPI Bradley Terry presents for elevated BP at time of recent OV for back pain. Back pain is now at baseline. Responded well to the steroid. He was noted to have elevated BP so he was asked to follow DASH program and record home bp READINGS. Home BP readings returned and reviewed. They show 90% over 140/90. Several over 160 systolic. He has had no cardio or cerebrovascular events. No chest pain or dyspnea. Family history negative. No DM.     09/04/2023   11:29 AM 07/05/2023    1:43 PM 09/12/2021    9:50 AM  Depression screen PHQ 2/9  Decreased Interest 0 0 0  Down, Depressed, Hopeless 0 0 0  PHQ - 2 Score 0 0 0    History Bradley Terry has a past medical history of Colon polyps.   He has a past surgical history that includes Spine surgery (1982, 1986).   His family history includes ALS in his father; COPD in his mother.He reports that he has never smoked. He has never used smokeless tobacco. He reports that he does not drink alcohol and does not use drugs.    ROS Review of Systems  Constitutional:  Negative for fever.  Respiratory:  Negative for shortness of breath.   Cardiovascular:  Negative for chest pain.  Musculoskeletal:  Positive for back pain (at baseline). Negative for arthralgias.  Skin:  Negative for rash.    Objective:  BP 136/82   Pulse 66   Temp (!) 97.4 F (36.3 C)   Ht 5' 7.5" (1.715 m)   Wt 186 lb 6.4 oz (84.6 kg)   SpO2 95%   BMI 28.76 kg/m   BP Readings from Last 3 Encounters:  09/04/23 136/82  07/05/23 (!) 175/97  09/12/21 (!) 141/81    Wt Readings from Last 3 Encounters:  09/04/23 186 lb 6.4 oz (84.6 kg)  07/05/23 182 lb 3.2 oz (82.6 kg)  09/12/21 183 lb 6.4 oz (83.2 kg)     Physical Exam Vitals reviewed.  Constitutional:      Appearance: He is well-developed.  HENT:     Head: Normocephalic and atraumatic.     Right Ear:  External ear normal.     Left Ear: External ear normal.     Mouth/Throat:     Pharynx: No oropharyngeal exudate or posterior oropharyngeal erythema.  Eyes:     Pupils: Pupils are equal, round, and reactive to light.  Cardiovascular:     Rate and Rhythm: Normal rate and regular rhythm.     Heart sounds: No murmur heard. Pulmonary:     Effort: No respiratory distress.     Breath sounds: Normal breath sounds.  Musculoskeletal:        General: Normal range of motion.     Cervical back: Normal range of motion and neck supple.  Skin:    General: Skin is warm and dry.  Neurological:     Mental Status: He is alert and oriented to person, place, and time.       Assessment & Plan:   Paiden was seen today for follow-up.  Diagnoses and all orders for this visit:  Primary hypertension -     BMP8+EGFR -     CMP14+EGFR; Future -     CBC with Differential/Platelet; Future -     Lipid panel; Future  Benign prostatic hyperplasia  with nocturia -     CMP14+EGFR; Future -     CBC with Differential/Platelet; Future -     Lipid panel; Future -     PSA, total and free; Future  Chronic bilateral low back pain without sciatica -     CMP14+EGFR; Future -     CBC with Differential/Platelet; Future -     Lipid panel; Future  Vitamin D deficiency -     CMP14+EGFR; Future -     CBC with Differential/Platelet; Future -     Lipid panel; Future -     VITAMIN D 25 Hydroxy (Vit-D Deficiency, Fractures); Future  Other orders -     lisinopril (ZESTRIL) 10 MG tablet; Take 1 tablet (10 mg total) by mouth daily.       I am having [name] start on lisinopril.  Allergies as of 09/04/2023       Reactions   Codeine Nausea And Vomiting        Medication List        Accurate as of September 04, 2023  1:51 PM. If you have any questions, ask your nurse or doctor.          lisinopril 10 MG tablet Commonly known as: ZESTRIL Take 1 tablet (10 mg total) by mouth daily. Started  by: Broadus John Genae Strine       Back exercise instruction sheet given. Continue DASH.  Follow-up: Return in about 1 month (around 10/05/2023) for hypertension, Compete physical.  Mechele Claude, M.D.

## 2023-09-04 NOTE — Patient Instructions (Signed)

## 2023-09-05 LAB — BMP8+EGFR
BUN/Creatinine Ratio: 15 (ref 10–24)
BUN: 15 mg/dL (ref 8–27)
CO2: 23 mmol/L (ref 20–29)
Calcium: 9.6 mg/dL (ref 8.6–10.2)
Chloride: 103 mmol/L (ref 96–106)
Creatinine, Ser: 0.97 mg/dL (ref 0.76–1.27)
Glucose: 102 mg/dL — ABNORMAL HIGH (ref 70–99)
Potassium: 4.5 mmol/L (ref 3.5–5.2)
Sodium: 140 mmol/L (ref 134–144)
eGFR: 85 mL/min/{1.73_m2} (ref >59)

## 2023-09-05 NOTE — Progress Notes (Signed)
Hello Bradley Terry,  Your lab result is normal and/or stable.Some minor variations that are not significant are commonly marked abnormal, but do not represent any medical problem for you.  Best regards, Claretta Fraise, M.D.

## 2023-09-11 DIAGNOSIS — H524 Presbyopia: Secondary | ICD-10-CM | POA: Insufficient documentation

## 2023-09-11 DIAGNOSIS — H43319 Vitreous membranes and strands, unspecified eye: Secondary | ICD-10-CM | POA: Insufficient documentation

## 2023-09-11 DIAGNOSIS — H04123 Dry eye syndrome of bilateral lacrimal glands: Secondary | ICD-10-CM | POA: Insufficient documentation

## 2023-09-12 ENCOUNTER — Other Ambulatory Visit: Payer: Medicare Other

## 2023-09-12 DIAGNOSIS — H02422 Myogenic ptosis of left eyelid: Secondary | ICD-10-CM | POA: Diagnosis not present

## 2023-09-12 DIAGNOSIS — H02421 Myogenic ptosis of right eyelid: Secondary | ICD-10-CM | POA: Diagnosis not present

## 2023-09-12 DIAGNOSIS — D485 Neoplasm of uncertain behavior of skin: Secondary | ICD-10-CM | POA: Diagnosis not present

## 2023-09-12 DIAGNOSIS — H02423 Myogenic ptosis of bilateral eyelids: Secondary | ICD-10-CM | POA: Diagnosis not present

## 2023-09-12 DIAGNOSIS — H02412 Mechanical ptosis of left eyelid: Secondary | ICD-10-CM | POA: Diagnosis not present

## 2023-09-12 DIAGNOSIS — H02831 Dermatochalasis of right upper eyelid: Secondary | ICD-10-CM | POA: Diagnosis not present

## 2023-09-12 DIAGNOSIS — H57813 Brow ptosis, bilateral: Secondary | ICD-10-CM | POA: Diagnosis not present

## 2023-09-12 DIAGNOSIS — H02413 Mechanical ptosis of bilateral eyelids: Secondary | ICD-10-CM | POA: Diagnosis not present

## 2023-09-12 DIAGNOSIS — H02834 Dermatochalasis of left upper eyelid: Secondary | ICD-10-CM | POA: Diagnosis not present

## 2023-09-12 DIAGNOSIS — H53483 Generalized contraction of visual field, bilateral: Secondary | ICD-10-CM | POA: Diagnosis not present

## 2023-09-12 DIAGNOSIS — H0279 Other degenerative disorders of eyelid and periocular area: Secondary | ICD-10-CM | POA: Diagnosis not present

## 2023-09-13 ENCOUNTER — Other Ambulatory Visit: Payer: Self-pay | Admitting: Family Medicine

## 2023-09-13 ENCOUNTER — Other Ambulatory Visit: Payer: Medicare Other

## 2023-09-13 DIAGNOSIS — I10 Essential (primary) hypertension: Secondary | ICD-10-CM | POA: Diagnosis not present

## 2023-09-13 DIAGNOSIS — E559 Vitamin D deficiency, unspecified: Secondary | ICD-10-CM

## 2023-09-13 DIAGNOSIS — M545 Low back pain, unspecified: Secondary | ICD-10-CM | POA: Diagnosis not present

## 2023-09-13 DIAGNOSIS — N401 Enlarged prostate with lower urinary tract symptoms: Secondary | ICD-10-CM

## 2023-09-14 LAB — CMP14+EGFR
ALT: 23 [IU]/L (ref 0–44)
AST: 22 [IU]/L (ref 0–40)
Albumin: 4.3 g/dL (ref 3.9–4.9)
Alkaline Phosphatase: 68 [IU]/L (ref 44–121)
BUN/Creatinine Ratio: 10 (ref 10–24)
BUN: 10 mg/dL (ref 8–27)
Bilirubin Total: 0.6 mg/dL (ref 0.0–1.2)
CO2: 22 mmol/L (ref 20–29)
Calcium: 9.3 mg/dL (ref 8.6–10.2)
Chloride: 100 mmol/L (ref 96–106)
Creatinine, Ser: 1 mg/dL (ref 0.76–1.27)
Globulin, Total: 2.5 g/dL (ref 1.5–4.5)
Glucose: 96 mg/dL (ref 70–99)
Potassium: 4.7 mmol/L (ref 3.5–5.2)
Sodium: 136 mmol/L (ref 134–144)
Total Protein: 6.8 g/dL (ref 6.0–8.5)
eGFR: 81 mL/min/{1.73_m2} (ref >59)

## 2023-09-14 LAB — LIPID PANEL
Chol/HDL Ratio: 3.6 {ratio} (ref 0.0–5.0)
Cholesterol, Total: 238 mg/dL — ABNORMAL HIGH (ref 100–199)
HDL: 67 mg/dL (ref >39)
LDL Chol Calc (NIH): 159 mg/dL — ABNORMAL HIGH (ref 0–99)
Triglycerides: 72 mg/dL (ref 0–149)
VLDL Cholesterol Cal: 12 mg/dL (ref 5–40)

## 2023-09-14 LAB — CBC WITH DIFFERENTIAL/PLATELET
Basophils Absolute: 0.1 10*3/uL (ref 0.0–0.2)
Basos: 1 %
EOS (ABSOLUTE): 0.2 10*3/uL (ref 0.0–0.4)
Eos: 4 %
Hematocrit: 48.5 % (ref 37.5–51.0)
Hemoglobin: 15.9 g/dL (ref 13.0–17.7)
Immature Grans (Abs): 0 10*3/uL (ref 0.0–0.1)
Immature Granulocytes: 0 %
Lymphocytes Absolute: 1.6 10*3/uL (ref 0.7–3.1)
Lymphs: 28 %
MCH: 30.9 pg (ref 26.6–33.0)
MCHC: 32.8 g/dL (ref 31.5–35.7)
MCV: 94 fL (ref 79–97)
Monocytes Absolute: 0.7 10*3/uL (ref 0.1–0.9)
Monocytes: 12 %
Neutrophils Absolute: 3.2 10*3/uL (ref 1.4–7.0)
Neutrophils: 55 %
Platelets: 224 10*3/uL (ref 150–450)
RBC: 5.14 x10E6/uL (ref 4.14–5.80)
RDW: 12.5 % (ref 11.6–15.4)
WBC: 5.8 10*3/uL (ref 3.4–10.8)

## 2023-09-14 LAB — PSA, TOTAL AND FREE
PSA, Free Pct: 17.4 %
PSA, Free: 0.54 ng/mL
Prostate Specific Ag, Serum: 3.1 ng/mL (ref 0.0–4.0)

## 2023-09-14 LAB — VITAMIN D 25 HYDROXY (VIT D DEFICIENCY, FRACTURES): Vit D, 25-Hydroxy: 30.9 ng/mL (ref 30.0–100.0)

## 2023-10-01 ENCOUNTER — Other Ambulatory Visit: Payer: Self-pay | Admitting: Family Medicine

## 2023-10-01 MED ORDER — ROSUVASTATIN CALCIUM 10 MG PO TABS: 10.0000 mg | ORAL_TABLET | Freq: Every day | ORAL | 1 refills | Status: DC

## 2023-10-16 ENCOUNTER — Encounter: Payer: Medicare Other | Admitting: Family Medicine

## 2023-10-22 DIAGNOSIS — H53483 Generalized contraction of visual field, bilateral: Secondary | ICD-10-CM | POA: Diagnosis not present

## 2023-10-24 ENCOUNTER — Encounter: Payer: Self-pay | Admitting: Family Medicine

## 2023-10-24 ENCOUNTER — Ambulatory Visit (INDEPENDENT_AMBULATORY_CARE_PROVIDER_SITE_OTHER): Payer: Medicare Other | Admitting: Family Medicine

## 2023-10-24 VITALS — BP 116/72 | HR 85 | Temp 97.4°F | Ht 67.5 in | Wt 182.0 lb

## 2023-10-24 DIAGNOSIS — N401 Enlarged prostate with lower urinary tract symptoms: Secondary | ICD-10-CM | POA: Diagnosis not present

## 2023-10-24 DIAGNOSIS — Z1211 Encounter for screening for malignant neoplasm of colon: Secondary | ICD-10-CM

## 2023-10-24 DIAGNOSIS — E782 Mixed hyperlipidemia: Secondary | ICD-10-CM

## 2023-10-24 DIAGNOSIS — Z0001 Encounter for general adult medical examination with abnormal findings: Secondary | ICD-10-CM | POA: Diagnosis not present

## 2023-10-24 DIAGNOSIS — I10 Essential (primary) hypertension: Secondary | ICD-10-CM | POA: Diagnosis not present

## 2023-10-24 DIAGNOSIS — R351 Nocturia: Secondary | ICD-10-CM

## 2023-10-24 DIAGNOSIS — Z0000 Encounter for general adult medical examination without abnormal findings: Secondary | ICD-10-CM

## 2023-10-24 MED ORDER — PROMETHAZINE-DM 6.25-15 MG/5ML PO SYRP: 5.0000 mL | ORAL_SOLUTION | Freq: Four times a day (QID) | ORAL | 0 refills | Status: DC | PRN

## 2023-10-24 MED ORDER — TAMSULOSIN HCL 0.4 MG PO CAPS: 0.4000 mg | ORAL_CAPSULE | Freq: Every day | ORAL | 3 refills | Status: DC

## 2023-10-24 MED ORDER — PREDNISONE 20 MG PO TABS: 20.0000 mg | ORAL_TABLET | Freq: Every day | ORAL | 0 refills | Status: AC

## 2023-10-24 NOTE — Progress Notes (Signed)
Subjective:  Patient ID: Bradley Terry, male    DOB: [birth year]  Age: 68 y.o. MRN: [id number]  CC: cold (Head cold for 12 days. Mucous ad coughing. Slight sinus pressure na dblood in mucous that is dark colored. Treating with otc cold meds.)   HPI Bradley Terry presents for Annual physical   presents for  follow-up of hypertension. Patient has no history of headache chest pain or shortness of breath or recent cough. Patient also denies symptoms of TIA such as focal numbness or weakness. Patient denies side effects from medication. States taking it regularly.   in for follow-up of elevated cholesterol. Doing well without complaints on current medication. Denies side effects of statin including myalgia and arthralgia and nausea. Just started one wek ago with rosuvastatin      09/04/2023   11:29 AM 07/05/2023    1:43 PM 09/12/2021    9:50 AM  Depression screen PHQ 2/9  Decreased Interest 0 0 0  Down, Depressed, Hopeless 0 0 0  PHQ - 2 Score 0 0 0    History Bradley Terry has a past medical history of Colon polyps.   He has a past surgical history that includes Spine surgery (1982, 1986).   His family history includes ALS in his father; COPD in his mother.He reports that he has never smoked. He has never used smokeless tobacco. He reports that he does not drink alcohol and does not use drugs.    ROS Review of Systems  Constitutional:  Negative for activity change, fatigue and unexpected weight change.  HENT:  Negative for congestion, ear pain, hearing loss, postnasal drip and trouble swallowing.   Eyes:  Negative for pain and visual disturbance.  Respiratory:  Negative for cough, chest tightness and shortness of breath.   Cardiovascular:  Negative for chest pain, palpitations and leg swelling.  Gastrointestinal:  Negative for abdominal distention, abdominal pain, blood in stool, constipation, diarrhea, nausea and vomiting.  Endocrine: Negative for cold intolerance, heat  intolerance and polydipsia.  Genitourinary:  Positive for difficulty urinating (bribbling, slow flow, nocturia X 1-2). Negative for dysuria, flank pain, frequency and urgency.  Musculoskeletal:  Negative for arthralgias and joint swelling.  Skin:  Negative for color change, rash and wound.  Neurological:  Negative for dizziness, syncope, speech difficulty, weakness, light-headedness, numbness and headaches.  Hematological:  Does not bruise/bleed easily.  Psychiatric/Behavioral:  Negative for confusion, decreased concentration, dysphoric mood and sleep disturbance. The patient is not nervous/anxious.     Objective:  BP 116/72   Pulse 85   Temp (!) 97.4 F (36.3 C)   Ht 5' 7.5" (1.715 m)   Wt 182 lb (82.6 kg)   SpO2 97%   BMI 28.08 kg/m   BP Readings from Last 3 Encounters:  10/24/23 116/72  09/04/23 136/82  07/05/23 (!) 175/97    Wt Readings from Last 3 Encounters:  10/24/23 182 lb (82.6 kg)  09/04/23 186 lb 6.4 oz (84.6 kg)  07/05/23 182 lb 3.2 oz (82.6 kg)     Physical Exam    Assessment & Plan:   Bradley Terry was seen today for cold.  Diagnoses and all orders for this visit:  Well adult exam  Screen for colon cancer -     Cologuard  Primary hypertension  Benign prostatic hyperplasia with nocturia  Mixed hyperlipidemia  Other orders -     tamsulosin (FLOMAX) 0.4 MG CAPS capsule; Take 1 capsule (0.4 mg total) by mouth at bedtime. For urine flow and prostate -  predniSONE (DELTASONE) 20 MG tablet; Take 1 tablet (20 mg total) by mouth daily with breakfast for 5 days. -     promethazine-dextromethorphan (PROMETHAZINE-DM) 6.25-15 MG/5ML syrup; Take 5 mLs by mouth 4 (four) times daily as needed for cough.       I am having Bradley Terry start on tamsulosin, predniSONE, and promethazine-dextromethorphan. I am also having him maintain his lisinopril, rosuvastatin, and Fish Oil.  Allergies as of 10/24/2023       Reactions   Codeine Nausea And Vomiting         Medication List        Accurate as of October 24, 2023 11:49 AM. If you have any questions, ask your nurse or doctor.          Fish Oil 300 MG Caps once.   lisinopril 10 MG tablet Commonly known as: ZESTRIL Take 1 tablet (10 mg total) by mouth daily.   predniSONE 20 MG tablet Commonly known as: DELTASONE Take 1 tablet (20 mg total) by mouth daily with breakfast for 5 days. Started by: Broadus John Shameria Trimarco   promethazine-dextromethorphan 6.25-15 MG/5ML syrup Commonly known as: PROMETHAZINE-DM Take 5 mLs by mouth 4 (four) times daily as needed for cough. Started by: Abigale Dorow   rosuvastatin 10 MG tablet Commonly known as: Crestor Take 1 tablet (10 mg total) by mouth daily. For cholesterol   tamsulosin 0.4 MG Caps capsule Commonly known as: FLOMAX Take 1 capsule (0.4 mg total) by mouth at bedtime. For urine flow and prostate Started by: Broadus John Chuck Caban         Follow-up: Return in about 6 months (around 04/22/2024) for hypertension, cholesterol.  Mechele Claude, M.D.

## 2023-10-25 ENCOUNTER — Ambulatory Visit (INDEPENDENT_AMBULATORY_CARE_PROVIDER_SITE_OTHER): Payer: Medicare Other | Admitting: Family Medicine

## 2023-10-25 ENCOUNTER — Telehealth: Payer: Self-pay | Admitting: Family Medicine

## 2023-10-25 ENCOUNTER — Encounter: Payer: Self-pay | Admitting: Family Medicine

## 2023-10-25 ENCOUNTER — Ambulatory Visit: Payer: Self-pay | Admitting: Family Medicine

## 2023-10-25 VITALS — BP 149/90 | HR 95 | Temp 98.7°F | Ht 67.5 in | Wt 183.0 lb

## 2023-10-25 DIAGNOSIS — R55 Syncope and collapse: Secondary | ICD-10-CM | POA: Diagnosis not present

## 2023-10-25 DIAGNOSIS — R42 Dizziness and giddiness: Secondary | ICD-10-CM | POA: Diagnosis not present

## 2023-10-25 DIAGNOSIS — R051 Acute cough: Secondary | ICD-10-CM

## 2023-10-25 DIAGNOSIS — R739 Hyperglycemia, unspecified: Secondary | ICD-10-CM | POA: Diagnosis not present

## 2023-10-25 MED ORDER — BENZONATATE 100 MG PO CAPS: 100.0000 mg | ORAL_CAPSULE | Freq: Three times a day (TID) | ORAL | 0 refills | Status: DC | PRN

## 2023-10-25 NOTE — Progress Notes (Signed)
Subjective:  Patient ID: Bradley Terry, male    DOB: [birth year], 68 y.o.   MRN: [id number]  Patient Care Team: Mechele Claude, MD as PCP - General (Family Medicine)   Chief Complaint:  Dizziness Sherlynn Stalls headed/Near Syncope)  HPI: Bradley Terry is a 68 y.o. male presenting on 10/25/2023 for Dizziness (Light headed/Near Syncope) States that he got up to get water and additional cough medication at 2 am. States that he went in the kitchen and "went down".  Reports that he did not lose consciousness or "black out". Reports that it felt like he fell "in slow motion". Denies trauma from fall. He is not on anticoagulation. Wife checked BP and it was 82/50s. Called EMS. States that EKG was normal. BP improved with IV by EMS. Declined going to hospital. Of note, states that he has been coughing a lot and taking cold medicines (promethazine-DM). States that the last 3 nights he has gotten up to cough when he normally sleeps through the night. Of note, patient has recently started flomax. First dose was yesterday ~7 pm. Additionally patient was started on Lisinopril in the last 3 months.   Relevant past medical, surgical, family, and social history reviewed and updated as indicated.  Allergies and medications reviewed and updated. Data reviewed: Chart in Epic.   Past Medical History:  Diagnosis Date   Colon polyps     Past Surgical History:  Procedure Laterality Date   SPINE SURGERY  1982, 1986   lumbar surgery    Social History   Socioeconomic History   Marital status: Married    Spouse name: Not on file   Number of children: 2   Years of education: Not on file   Highest education level: Not on file  Occupational History   Not on file  Tobacco Use   Smoking status: Never   Smokeless tobacco: Never  Substance and Sexual Activity   Alcohol use: No   Drug use: No   Sexual activity: Yes    Birth control/protection: Post-menopausal  Other Topics Concern   Not on file  Social  History Narrative   Not on file   Social Drivers of Health   Financial Resource Strain: Low Risk  (06/26/2019)   Overall Financial Resource Strain (CARDIA)    Difficulty of Paying Living Expenses: Not very hard  Food Insecurity: Unknown (06/26/2019)   Hunger Vital Sign    Worried About Running Out of Food in the Last Year: Not on file    Ran Out of Food in the Last Year: Never true  Transportation Needs: No Transportation Needs (06/26/2019)   PRAPARE - Administrator, Civil Service (Medical): No    Lack of Transportation (Non-Medical): No  Physical Activity: Sufficiently Active (06/26/2019)   Exercise Vital Sign    Days of Exercise per Week: 5 days    Minutes of Exercise per Session: 60 min  Stress: No Stress Concern Present (06/26/2019)   [employer] of Occupational Health - Occupational Stress Questionnaire    Feeling of Stress : Only a little  Social Connections: Not on file  Intimate Partner Violence: Not on file    Outpatient Encounter Medications as of 10/25/2023  Medication Sig   lisinopril (ZESTRIL) 10 MG tablet Take 1 tablet (10 mg total) by mouth daily.   Omega-3 Fatty Acids (FISH OIL) 300 MG CAPS once.   predniSONE (DELTASONE) 20 MG tablet Take 1 tablet (20 mg total) by mouth daily with breakfast for  5 days.   promethazine-dextromethorphan (PROMETHAZINE-DM) 6.25-15 MG/5ML syrup Take 5 mLs by mouth 4 (four) times daily as needed for cough.   rosuvastatin (CRESTOR) 10 MG tablet Take 1 tablet (10 mg total) by mouth daily. For cholesterol   tamsulosin (FLOMAX) 0.4 MG CAPS capsule Take 1 capsule (0.4 mg total) by mouth at bedtime. For urine flow and prostate   No facility-administered encounter medications on file as of 10/25/2023.    Allergies  Allergen Reactions   Codeine Nausea And Vomiting    Review of Systems As per HPI  Objective:  BP (!) 149/90   Pulse 95   Temp 98.7 F (37.1 C)   Ht 5' 7.5" (1.715 m)   Wt 183 lb (83 kg)   SpO2 96%   BMI  28.24 kg/m    Orthostatic VS for the past 72 hrs (Last 3 readings):  Orthostatic BP Patient Position BP Location Cuff Size Orthostatic Pulse  10/25/23 1119 144/89 Standing Right Arm Normal 96  10/25/23 1118 149/90 Sitting Right Arm -- 95  10/25/23 1117 149/89 Supine Right Arm Normal 91     Wt Readings from Last 3 Encounters:  10/24/23 182 lb (82.6 kg)  09/04/23 186 lb 6.4 oz (84.6 kg)  07/05/23 182 lb 3.2 oz (82.6 kg)   Physical Exam Constitutional:      General: He is awake. He is not in acute distress.    Appearance: Normal appearance. He is well-developed and well-groomed. He is not ill-appearing, toxic-appearing or diaphoretic.  Cardiovascular:     Rate and Rhythm: Normal rate and regular rhythm.     Pulses: Normal pulses.          Radial pulses are 2+ on the right side and 2+ on the left side.       Posterior tibial pulses are 2+ on the right side and 2+ on the left side.     Heart sounds: Normal heart sounds. No murmur heard.    No gallop.  Pulmonary:     Effort: Pulmonary effort is normal. No respiratory distress.     Breath sounds: Normal breath sounds. No stridor. No wheezing, rhonchi or rales.  Musculoskeletal:     Cervical back: Full passive range of motion without pain and neck supple.     Right lower leg: No edema.     Left lower leg: No edema.  Skin:    General: Skin is warm.     Capillary Refill: Capillary refill takes less than 2 seconds.  Neurological:     General: No focal deficit present.     Mental Status: He is alert, oriented to person, place, and time and easily aroused. Mental status is at baseline.     GCS: GCS eye subscore is 4. GCS verbal subscore is 5. GCS motor subscore is 6.     Motor: No weakness.     Gait: Gait normal.  Psychiatric:        Attention and Perception: Attention and perception normal.        Mood and Affect: Mood and affect normal.        Speech: Speech normal.        Behavior: Behavior normal. Behavior is cooperative.         Thought Content: Thought content normal. Thought content does not include homicidal or suicidal ideation. Thought content does not include homicidal or suicidal plan.        Cognition and Memory: Cognition and memory normal.  Judgment: Judgment normal.     Results for orders placed or performed in visit on 09/13/23  CMP14+EGFR   Collection Time: 09/13/23 10:36 AM  Result Value Ref Range   Glucose 96 70 - 99 mg/dL   BUN 10 8 - 27 mg/dL   Creatinine, Ser 1.61 0.76 - 1.27 mg/dL   eGFR 81 >09 UE/AVW/0.98   BUN/Creatinine Ratio 10 10 - 24   Sodium 136 134 - 144 mmol/L   Potassium 4.7 3.5 - 5.2 mmol/L   Chloride 100 96 - 106 mmol/L   CO2 22 20 - 29 mmol/L   Calcium 9.3 8.6 - 10.2 mg/dL   Total Protein 6.8 6.0 - 8.5 g/dL   Albumin 4.3 3.9 - 4.9 g/dL   Globulin, Total 2.5 1.5 - 4.5 g/dL   Bilirubin Total 0.6 0.0 - 1.2 mg/dL   Alkaline Phosphatase 68 44 - 121 IU/L   AST 22 0 - 40 IU/L   ALT 23 0 - 44 IU/L  CBC with Differential/Platelet   Collection Time: 09/13/23 10:36 AM  Result Value Ref Range   WBC 5.8 3.4 - 10.8 x10E3/uL   RBC 5.14 4.14 - 5.80 x10E6/uL   Hemoglobin 15.9 13.0 - 17.7 g/dL   Hematocrit 11.9 14.7 - 51.0 %   MCV 94 79 - 97 fL   MCH 30.9 26.6 - 33.0 pg   MCHC 32.8 31.5 - 35.7 g/dL   RDW 82.9 56.2 - 13.0 %   Platelets 224 150 - 450 x10E3/uL   Neutrophils 55 Not Estab. %   Lymphs 28 Not Estab. %   Monocytes 12 Not Estab. %   Eos 4 Not Estab. %   Basos 1 Not Estab. %   Neutrophils Absolute 3.2 1.4 - 7.0 x10E3/uL   Lymphocytes Absolute 1.6 0.7 - 3.1 x10E3/uL   Monocytes Absolute 0.7 0.1 - 0.9 x10E3/uL   EOS (ABSOLUTE) 0.2 0.0 - 0.4 x10E3/uL   Basophils Absolute 0.1 0.0 - 0.2 x10E3/uL   Immature Granulocytes 0 Not Estab. %   Immature Grans (Abs) 0.0 0.0 - 0.1 x10E3/uL  Lipid panel   Collection Time: 09/13/23 10:36 AM  Result Value Ref Range   Cholesterol, Total 238 (H) 100 - 199 mg/dL   Triglycerides 72 0 - 149 mg/dL   HDL 67 >86 mg/dL   VLDL  Cholesterol Cal 12 5 - 40 mg/dL   LDL Chol Calc (NIH) 578 (H) 0 - 99 mg/dL   Chol/HDL Ratio 3.6 0.0 - 5.0 ratio  PSA, total and free   Collection Time: 09/13/23 10:36 AM  Result Value Ref Range   Prostate Specific Ag, Serum 3.1 0.0 - 4.0 ng/mL   PSA, Free 0.54 N/A ng/mL   PSA, Free Pct 17.4 %  VITAMIN D 25 Hydroxy (Vit-D Deficiency, Fractures)   Collection Time: 09/13/23 10:36 AM  Result Value Ref Range   Vit D, 25-Hydroxy 30.9 30.0 - 100.0 ng/mL       10/25/2023   11:20 AM 09/04/2023   11:29 AM 07/05/2023    1:43 PM 09/12/2021    9:50 AM 04/26/2021    3:28 PM  Depression screen PHQ 2/9  Decreased Interest 0 0 0 0 0  Down, Depressed, Hopeless 0 0 0 0 0  PHQ - 2 Score 0 0 0 0 0       10/25/2023   11:20 AM  GAD 7 : Generalized Anxiety Score  Nervous, Anxious, on Edge 0  Control/stop worrying 0  Worry too much - different things  0  Trouble relaxing 0  Restless 0  Easily annoyed or irritable 0  Afraid - awful might happen 0  Total GAD 7 Score 0  Anxiety Difficulty Not difficult at all    Pertinent labs & imaging results that were available during my care of the patient were reviewed by me and considered in my medical decision making.  Assessment & Plan:  Reagen was seen today for dizziness.  Diagnoses and all orders for this visit:  Postural dizziness with presyncope Discussed with pt that likely multifactorial with start of flomax, use of antihypertensive, coughing, dehydration and use of promethazine-DM. Negative Orthostatic vitals on exam today. Encouraged pt to increase hydration, reduce use of cough syrup, slow transitions from positions, and to start compression stockings. Can continue flomax with caution. Monitor BP at home and bring measurements to PCP.  -     Compression stockings -     CMP14+EGFR -     CBC with Differential/Platelet -     Magnesium  Acute cough Will provide medication as below for patient to assist with cough symptoms. Encouraged patient to  follow up if not improving for further evaluation.  -     benzonatate (TESSALON PERLES) 100 MG capsule; Take 1 capsule (100 mg total) by mouth 3 (three) times daily as needed.  Continue all other maintenance medications.  Follow up plan: Return if symptoms worsen or fail to improve.   Continue healthy lifestyle choices, including diet (rich in fruits, vegetables, and lean proteins, and low in salt and simple carbohydrates) and exercise (at least 30 minutes of moderate physical activity daily).  Written and verbal instructions provided   The above assessment and management plan was discussed with the patient. The patient verbalized understanding of and has agreed to the management plan. Patient is aware to call the clinic if they develop any new symptoms or if symptoms persist or worsen. Patient is aware when to return to the clinic for a follow-up visit. Patient educated on when it is appropriate to go to the emergency department.   Neale Burly, DNP-FNP [facility] [address] [place], [place] [address] [phone]

## 2023-10-25 NOTE — Telephone Encounter (Signed)
Copied from CRM [id number]. Topic: Clinical - Prescription Issue >> Oct 25, 2023  4:03 PM Adelina Mings wrote: Reason for CRM: wife needs to know what type of compression stocking patient needs , the form that was sent to pharmacy didn't specify.

## 2023-10-25 NOTE — Telephone Encounter (Signed)
Chief Complaint: LOC/fainting Symptoms: LOC ("not unconscious for long" per wife), "didn't feel quite right", cough with green/yellow phlegm Frequency: last night at 0230 while getting up to the kitchen for water Pertinent Negatives: Patient denies CP, SOB, heart palpitations, current dizziness or weakness, N/V/abd pain/bloody stools Disposition: [] ED /[] Urgent Care (no appt availability in office) / [x] Appointment(In office/virtual)/ []  [facility]/ [] Home Care/ [] Refused Recommended Disposition /[] [facility]/ []  Follow-up with PCP  Additional Notes: Pt's wife reports pt lost consciousness at 0230 while getting up to get a drink of water in the kitchen. Wife states "pt wasn't unconscious for long." Pt denied SOB, CP, palpitations during that time. Wife called 911, EMS arrived. Wife states pt's BP was 80/50 per EMS and pt was very pale. Pt and wife declined transport to the ED. EMS gave the pt a liter of IV fluid, re-checked his BP which had improved, and that the pt was ambulatory when EMS left the scene. EMS recommended pt follow-up with PCP in the AM, hence wife's call today. Today, pt denies CP, SOB, palpitations, dizziness, weakness, numbness, N/V/D/bloody stools. Wife states pt "feels blah" and still has a productive cough with green/yellow phlegm. Of note, pt was seen by Dr. Darlyn Read yesterday for cough and urinary symptoms. Wife states pt took newly prescribed tamsulosin last night for the first time and took newly prescribed prednisone this morning. Wife concerned that pt may have RSV. Pt denies difficulty breathing, but wife states they just returned from a 12-day cruise where they assume they encountered sick contacts. Per protocol, given the patient's age, this RN advised pt to go to the ED/PCP with approval. Wife declined the ED, stating they do not want to sit and wait in the ED. This RN called [facility]'s CAL line and spoke to nurse Dahlia Client. Nurse Dahlia Client stated the office is  comfortable seeing the patient today in office with the DOD. This RN scheduled the pt for 1105 today at [facility]. Pt and wife agreeable to the plan. This RN advised wife to call back if pt exhibits any worsening symptoms. Wife verbalized understanding.    Copied from CRM [id number]. Topic: Clinical - Red Word Triage >> Oct 25, 2023  9:16 AM Desma Mcgregor wrote: Red Word that prompted transfer to Nurse Triage: Patient passed out last night. 911 was called and wife Gavin Pound wants to speak with a nurse about what's happening and some additional symptoms. Reason for Disposition  [1] Age > 50 years  AND [2] now alert and feels fine  Answer Assessment - Initial Assessment Questions 1. ONSET: "How long were you unconscious?" (minutes) "When did it happen?"     Last night at 0230 in the kitchen while getting up to get something to drink. "Not unconscious for long." - per wife 2. CONTENT: "What happened during period of unconsciousness?" (e.g., seizure activity)      Wife heard pt drop to the floor. Pt pale per wife. Pt states he "didn't feel quite right." EMS was called. Pt declined transport to ED. EMS gave bag of IV fluid. BP improved, pt was ambulatory when EMS left scene. 3. MENTAL STATUS: "Alert and oriented now?" (oriented x 3 = name, month, location)      "Yeah, we just went outside and put wood in the stove. He is himself." He ate a bowl of cereal. 4. TRIGGER: "What do you think caused the fainting?" "What were you doing just before you fainted?"  (e.g., exercise, sudden standing up, prolonged standing)  Pt was getting up for a glass of water. Wife concerned about RSV (pt denies any difficulty breathing - wife just concerned) due to pt's cough and green/yellow mucus. Pt got sick after a 12-day cruise. Dr. Darlyn Read prescribed prednisone yesterday for cough and tamsulosin for nocturia/retention. Pt took a cough medication yesterday and tamsulosin for the first time last night. Pt took prednisone today. 5.  RECURRENT SYMPTOM: "Have you ever passed out before?" If Yes, ask: "When was the last time?" and "What happened that time?"      No 6. INJURY: "Did you sustain any injury during the fall?"      Did not hit head, no injuries. No blood thinners.  7. CARDIAC SYMPTOMS: "Have you had any of the following symptoms: chest pain, difficulty breathing, palpitations?"     None - denied CP, SOB, palpitations. 8. NEUROLOGIC SYMPTOMS: "Have you had any of the following symptoms: headache, numbness, vertigo, weakness?"     No headache. No numbness and weakness.  9. GI SYMPTOMS: "Have you had any of the following symptoms: abdomen pain, vomiting, diarrhea, blood in stools?"     None 10. OTHER SYMPTOMS: "Do you have any other symptoms?"       Productive cough with phlegm, scratchy throat. Wife states pt is "just feeling blah."  Protocols used: Fainting-A-AH

## 2023-10-26 ENCOUNTER — Encounter: Payer: Self-pay | Admitting: Family Medicine

## 2023-10-26 LAB — CMP14+EGFR
ALT: 62 [IU]/L — ABNORMAL HIGH (ref 0–44)
AST: 29 [IU]/L (ref 0–40)
Albumin: 4 g/dL (ref 3.9–4.9)
Alkaline Phosphatase: 79 [IU]/L (ref 44–121)
BUN/Creatinine Ratio: 14 (ref 10–24)
BUN: 11 mg/dL (ref 8–27)
Bilirubin Total: 0.4 mg/dL (ref 0.0–1.2)
CO2: 23 mmol/L (ref 20–29)
Calcium: 9.4 mg/dL (ref 8.6–10.2)
Chloride: 102 mmol/L (ref 96–106)
Creatinine, Ser: 0.81 mg/dL (ref 0.76–1.27)
Globulin, Total: 2.8 g/dL (ref 1.5–4.5)
Glucose: 135 mg/dL — ABNORMAL HIGH (ref 70–99)
Potassium: 4.9 mmol/L (ref 3.5–5.2)
Sodium: 139 mmol/L (ref 134–144)
Total Protein: 6.8 g/dL (ref 6.0–8.5)
eGFR: 95 mL/min/{1.73_m2} (ref >59)

## 2023-10-26 LAB — CBC WITH DIFFERENTIAL/PLATELET
Basophils Absolute: 0.1 10*3/uL (ref 0.0–0.2)
Basos: 0 %
EOS (ABSOLUTE): 0.1 10*3/uL (ref 0.0–0.4)
Eos: 0 %
Hematocrit: 43.2 % (ref 37.5–51.0)
Hemoglobin: 14 g/dL (ref 13.0–17.7)
Immature Grans (Abs): 0.1 10*3/uL (ref 0.0–0.1)
Immature Granulocytes: 1 %
Lymphocytes Absolute: 0.9 10*3/uL (ref 0.7–3.1)
Lymphs: 6 %
MCH: 29.7 pg (ref 26.6–33.0)
MCHC: 32.4 g/dL (ref 31.5–35.7)
MCV: 92 fL (ref 79–97)
Monocytes Absolute: 0.4 10*3/uL (ref 0.1–0.9)
Monocytes: 3 %
Neutrophils Absolute: 13.9 10*3/uL — ABNORMAL HIGH (ref 1.4–7.0)
Neutrophils: 90 %
Platelets: 346 10*3/uL (ref 150–450)
RBC: 4.72 x10E6/uL (ref 4.14–5.80)
RDW: 12.3 % (ref 11.6–15.4)
WBC: 15.5 10*3/uL — ABNORMAL HIGH (ref 3.4–10.8)

## 2023-10-26 LAB — MAGNESIUM: Magnesium: 2.2 mg/dL (ref 1.6–2.3)

## 2023-10-26 NOTE — Progress Notes (Signed)
Slightly elevated BG, can we add on A1C? Slightly elevated ALT. Recommend patient avoid liver toxic medications such as tylenol. Increased WBC and neutrophils, likely due to URI symptoms. Will continue to monitor.

## 2023-10-29 LAB — HGB A1C W/O EAG: Hgb A1c MFr Bld: 6 % — ABNORMAL HIGH (ref 4.8–5.6)

## 2023-10-29 LAB — SPECIMEN STATUS REPORT

## 2023-10-29 NOTE — Telephone Encounter (Signed)
Lmtcb

## 2023-10-30 NOTE — Progress Notes (Signed)
A1C in prediabetes range. Recommend for patient to work on lifestyle modifications, including diet (rich in fruits, vegetables, and lean proteins, and low in salt and simple carbohydrates) and exercise (at least 30 minutes of moderate physical activity daily). Limit beverages high is sugar. Recommend at least 80-100 oz of water daily.

## 2023-11-01 NOTE — Telephone Encounter (Signed)
Left detailed message stating to use Knee high compression stocking and to reach out if needed. LS

## 2023-11-05 DIAGNOSIS — Z1211 Encounter for screening for malignant neoplasm of colon: Secondary | ICD-10-CM | POA: Diagnosis not present

## 2023-11-06 DIAGNOSIS — R3912 Poor urinary stream: Secondary | ICD-10-CM | POA: Diagnosis not present

## 2023-11-12 ENCOUNTER — Encounter: Payer: Self-pay | Admitting: Family Medicine

## 2023-11-12 ENCOUNTER — Other Ambulatory Visit: Payer: Self-pay | Admitting: Family Medicine

## 2023-11-12 DIAGNOSIS — R195 Other fecal abnormalities: Secondary | ICD-10-CM

## 2023-11-12 LAB — COLOGUARD: COLOGUARD: POSITIVE — AB

## 2023-11-23 ENCOUNTER — Telehealth: Payer: Self-pay | Admitting: *Deleted

## 2023-11-23 NOTE — Telephone Encounter (Signed)
 Copied from CRM [id number]. Topic: Clinical - Medical Advice >> Nov 23, 2023  3:42 PM Mosetta Putt H wrote: Reason for CRM: Patient wife is requesting a appointment for colonoscopy

## 2023-11-25 ENCOUNTER — Other Ambulatory Visit: Payer: Self-pay | Admitting: Family Medicine

## 2023-11-25 DIAGNOSIS — R195 Other fecal abnormalities: Secondary | ICD-10-CM

## 2023-12-18 DIAGNOSIS — R3912 Poor urinary stream: Secondary | ICD-10-CM | POA: Diagnosis not present

## 2023-12-24 ENCOUNTER — Encounter: Payer: Self-pay | Admitting: Internal Medicine

## 2023-12-25 DIAGNOSIS — R3912 Poor urinary stream: Secondary | ICD-10-CM | POA: Diagnosis not present

## 2024-01-03 ENCOUNTER — Ambulatory Visit (AMBULATORY_SURGERY_CENTER)

## 2024-01-03 VITALS — Ht 67.5 in | Wt 175.0 lb

## 2024-01-03 DIAGNOSIS — Z1211 Encounter for screening for malignant neoplasm of colon: Secondary | ICD-10-CM

## 2024-01-03 MED ORDER — NA SULFATE-K SULFATE-MG SULF 17.5-3.13-1.6 GM/177ML PO SOLN: 1.0000 | Freq: Once | ORAL | 0 refills | Status: AC

## 2024-01-03 NOTE — Progress Notes (Addendum)
 Pre visit completed via phone call; Patient verified name, DOB, and address; No egg or soy allergy known to patient;  No issues known to pt with past sedation with any surgeries or procedures; Patient denies ever being told they had issues or difficulty with intubation;  No FH of Malignant Hyperthermia; Pt is not on diet pills; Pt is not on home 02;  Pt is not on blood thinners;  Pt denies issues with constipation  No A fib or A flutter; Have any cardiac testing pending--NO Insurance verified during PV appt--- [facility] Medicare Pt can ambulate without assistance;  Pt denies use of chewing tobacco; Discussed diabetic/weight loss medication holds; Discussed NSAID holds; Checked BMI to be less than 50; Pt instructed to use Single[employer]com or GoodRx for a price reduction on prep;   Pre visit completed and red dot placed by patient's name on their procedure day (on provider's schedule);    Instructions sent to MyChart per patient request;

## 2024-01-17 ENCOUNTER — Encounter: Payer: Self-pay | Admitting: Internal Medicine

## 2024-01-22 ENCOUNTER — Encounter: Payer: Self-pay | Admitting: Internal Medicine

## 2024-01-22 ENCOUNTER — Ambulatory Visit (AMBULATORY_SURGERY_CENTER): Admitting: Internal Medicine

## 2024-01-22 VITALS — BP 96/63 | HR 51 | Temp 98.4°F | Resp 10 | Ht 67.5 in | Wt 175.0 lb

## 2024-01-22 DIAGNOSIS — R195 Other fecal abnormalities: Secondary | ICD-10-CM

## 2024-01-22 DIAGNOSIS — Z1211 Encounter for screening for malignant neoplasm of colon: Secondary | ICD-10-CM | POA: Diagnosis not present

## 2024-01-22 MED ORDER — SODIUM CHLORIDE 0.9 % IV SOLN
500.0000 mL | Freq: Once | INTRAVENOUS | Status: DC
Start: 2024-01-22 — End: 2024-01-22

## 2024-01-22 NOTE — Progress Notes (Signed)
 A/O x 3, gd SR's, VSS, report to RN

## 2024-01-22 NOTE — Patient Instructions (Signed)
Resume previous diet. Continue present medications.  Repeat colonoscopy is not recommended for screening purposes.   YOU HAD AN ENDOSCOPIC PROCEDURE TODAY AT THE [facility]:   Refer to the procedure report that was given to you for any specific questions about what was found during the examination.  If the procedure report does not answer your questions, please call your gastroenterologist to clarify.  If you requested that your care partner not be given the details of your procedure findings, then the procedure report has been included in a sealed envelope for you to review at your convenience later.  YOU SHOULD EXPECT: Some feelings of bloating in the abdomen. Passage of more gas than usual.  Walking can help get rid of the air that was put into your GI tract during the procedure and reduce the bloating. If you had a lower endoscopy (such as a colonoscopy or flexible sigmoidoscopy) you may notice spotting of blood in your stool or on the toilet paper. If you underwent a bowel prep for your procedure, you may not have a normal bowel movement for a few days.  Please Note:  You might notice some irritation and congestion in your nose or some drainage.  This is from the oxygen used during your procedure.  There is no need for concern and it should clear up in a day or so.  SYMPTOMS TO REPORT IMMEDIATELY:  Following lower endoscopy (colonoscopy or flexible sigmoidoscopy):  Excessive amounts of blood in the stool  Significant tenderness or worsening of abdominal pains  Swelling of the abdomen that is new, acute  Fever of 100F or higher  For urgent or emergent issues, a gastroenterologist can be reached at any hour by calling [phone]. Do not use MyChart messaging for urgent concerns.    DIET:  We do recommend a small meal at first, but then you may proceed to your regular diet.  Drink plenty of fluids but you should avoid alcoholic beverages for 24 hours.  ACTIVITY:  You  should plan to take it easy for the rest of today and you should NOT DRIVE or use heavy machinery until tomorrow (because of the sedation medicines used during the test).    FOLLOW UP: Our staff will call the number listed on your records the next business day following your procedure.  We will call around 7:15- 8:00 am to check on you and address any questions or concerns that you may have regarding the information given to you following your procedure. If we do not reach you, we will leave a message.     If any biopsies were taken you will be contacted by phone or by letter within the next 1-3 weeks.  Please call us at [phone] if you have not heard about the biopsies in 3 weeks.    SIGNATURES/CONFIDENTIALITY: You and/or your care partner have signed paperwork which will be entered into your electronic medical record.  These signatures attest to the fact that that the information above on your After Visit Summary has been reviewed and is understood.  Full responsibility of the confidentiality of this discharge information lies with you and/or your care-partner.

## 2024-01-22 NOTE — Progress Notes (Signed)
 HISTORY OF PRESENT ILLNESS:  Bradley Terry is a 68 y.o. male sent today for colonoscopy after testing positive for Cologuard.  Remote colonoscopy 2009 was negative for neoplasia  REVIEW OF SYSTEMS:  All non-GI ROS negative except for  Past Medical History:  Diagnosis Date   BPH (benign prostatic hyperplasia)    Colon polyps    Hyperlipidemia    on meds   Hypertension    on meds    Past Surgical History:  Procedure Laterality Date   COLONOSCOPY  2009   DP-F/V-goly(exc)-HPP   SPINE SURGERY  1982, 1986   lumbar surgery    Social History KENDYN ZAMAN  reports that he has never smoked. He has never used smokeless tobacco. He reports that he does not drink alcohol and does not use drugs.  family history includes ALS in his father; COPD in his mother.  Allergies  Allergen Reactions   Codeine Nausea And Vomiting       PHYSICAL EXAMINATION: Vital signs: BP (!) 142/88   Pulse 63   Temp 98.4 F (36.9 C)   Ht 5' 7.5" (1.715 m)   Wt 175 lb (79.4 kg)   SpO2 99%   BMI 27.00 kg/m  General: Well-developed, well-nourished, no acute distress HEENT: Sclerae are anicteric, conjunctiva pink. Oral mucosa intact Lungs: Clear Heart: Regular Abdomen: soft, nontender, nondistended, no obvious ascites, no peritoneal signs, normal bowel sounds. No organomegaly. Extremities: No edema Psychiatric: alert and oriented x3. Cooperative     ASSESSMENT:  Positive Cologuard testing   PLAN:   Colonoscopy

## 2024-01-22 NOTE — Progress Notes (Signed)
 Pt's states no medical or surgical changes since previsit or office visit.

## 2024-01-22 NOTE — Op Note (Signed)
 [facility] Patient Name: Bradley Terry Procedure Date: 01/22/2024 11:10 AM MRN: [id number] Endoscopist: Murel Arlington. Elvin Hammer , MD, [id number] Age: 68 Referring MD:  Date of Birth: 1956-04-02 Gender: Male Account #: [id number] Procedure:                Colonoscopy Indications:              Positive Cologuard test, screening colonoscopy.                            Previous examination with Dr. Adan Holms 2009 was                            normal Medicines:                Monitored Anesthesia Care Procedure:                Pre-Anesthesia Assessment:                           - Prior to the procedure, a History and Physical                            was performed, and patient medications and                            allergies were reviewed. The patient's tolerance of                            previous anesthesia was also reviewed. The risks                            and benefits of the procedure and the sedation                            options and risks were discussed with the patient.                            All questions were answered, and informed consent                            was obtained. Prior Anticoagulants: The patient has                            taken no anticoagulant or antiplatelet agents. ASA                            Grade Assessment: II - A patient with mild systemic                            disease. After reviewing the risks and benefits,                            the patient was deemed in satisfactory condition to  undergo the procedure.                           After obtaining informed consent, the colonoscope                            was passed under direct vision. Throughout the                            procedure, the patient's blood pressure, pulse, and                            oxygen saturations were monitored continuously. The                            Olympus Scope SN: [id number] was introduced through                             the anus and advanced to the the cecum, identified                            by appendiceal orifice and ileocecal valve. The                            ileocecal valve, appendiceal orifice, and rectum                            were photographed. The quality of the bowel                            preparation was excellent. The colonoscopy was                            performed without difficulty. The patient tolerated                            the procedure well. The bowel preparation used was                            SUPREP via split dose instruction. Scope In: 11:27:47 AM Scope Out: 11:44:49 AM Scope Withdrawal Time: 0 hours 10 minutes 44 seconds  Total Procedure Duration: 0 hours 17 minutes 2 seconds  Findings:                 The entire examined colon appeared normal on direct                            and retroflexion views. Complications:            No immediate complications. Estimated blood loss:                            None. Estimated Blood Loss:     Estimated blood loss: none. Impression:               - The entire examined  colon is normal on direct and                            retroflexion views.                           - No specimens collected. Recommendation:           - Repeat colonoscopy is not recommended for                            screening purposes.                           - Patient has a contact number available for                            emergencies. The signs and symptoms of potential                            delayed complications were discussed with the                            patient. Return to normal activities tomorrow.                            Written discharge instructions were provided to the                            patient.                           - Resume previous diet.                           - Continue present medications. Murel Arlington. Elvin Hammer, MD 01/22/2024 11:48:33 AM This report has been signed  electronically.

## 2024-01-23 ENCOUNTER — Telehealth: Payer: Self-pay

## 2024-01-23 NOTE — Telephone Encounter (Signed)
 Left message on answering machine.

## 2024-03-22 ENCOUNTER — Other Ambulatory Visit: Payer: Self-pay | Admitting: Family Medicine

## 2024-04-03 DIAGNOSIS — H04123 Dry eye syndrome of bilateral lacrimal glands: Secondary | ICD-10-CM | POA: Diagnosis not present

## 2024-04-03 DIAGNOSIS — H02882 Meibomian gland dysfunction right lower eyelid: Secondary | ICD-10-CM | POA: Diagnosis not present

## 2024-04-03 DIAGNOSIS — S0501XA Injury of conjunctiva and corneal abrasion without foreign body, right eye, initial encounter: Secondary | ICD-10-CM | POA: Diagnosis not present

## 2024-04-03 DIAGNOSIS — H02885 Meibomian gland dysfunction left lower eyelid: Secondary | ICD-10-CM | POA: Diagnosis not present

## 2024-04-08 DIAGNOSIS — L820 Inflamed seborrheic keratosis: Secondary | ICD-10-CM | POA: Diagnosis not present

## 2024-04-08 DIAGNOSIS — H02421 Myogenic ptosis of right eyelid: Secondary | ICD-10-CM | POA: Diagnosis not present

## 2024-04-08 DIAGNOSIS — H53483 Generalized contraction of visual field, bilateral: Secondary | ICD-10-CM | POA: Diagnosis not present

## 2024-04-08 DIAGNOSIS — H02831 Dermatochalasis of right upper eyelid: Secondary | ICD-10-CM | POA: Diagnosis not present

## 2024-04-08 DIAGNOSIS — H02413 Mechanical ptosis of bilateral eyelids: Secondary | ICD-10-CM | POA: Diagnosis not present

## 2024-04-08 DIAGNOSIS — H02422 Myogenic ptosis of left eyelid: Secondary | ICD-10-CM | POA: Diagnosis not present

## 2024-04-08 DIAGNOSIS — H02412 Mechanical ptosis of left eyelid: Secondary | ICD-10-CM | POA: Diagnosis not present

## 2024-04-08 DIAGNOSIS — D485 Neoplasm of uncertain behavior of skin: Secondary | ICD-10-CM | POA: Diagnosis not present

## 2024-04-08 DIAGNOSIS — H02423 Myogenic ptosis of bilateral eyelids: Secondary | ICD-10-CM | POA: Diagnosis not present

## 2024-04-08 DIAGNOSIS — H02411 Mechanical ptosis of right eyelid: Secondary | ICD-10-CM | POA: Diagnosis not present

## 2024-04-08 DIAGNOSIS — H57813 Brow ptosis, bilateral: Secondary | ICD-10-CM | POA: Diagnosis not present

## 2024-04-08 DIAGNOSIS — H02834 Dermatochalasis of left upper eyelid: Secondary | ICD-10-CM | POA: Diagnosis not present

## 2024-04-08 DIAGNOSIS — H0279 Other degenerative disorders of eyelid and periocular area: Secondary | ICD-10-CM | POA: Diagnosis not present

## 2024-04-23 ENCOUNTER — Ambulatory Visit: Payer: Medicare Other | Admitting: Family Medicine

## 2024-05-05 DIAGNOSIS — H53483 Generalized contraction of visual field, bilateral: Secondary | ICD-10-CM | POA: Diagnosis not present

## 2024-05-07 DIAGNOSIS — H18413 Arcus senilis, bilateral: Secondary | ICD-10-CM | POA: Diagnosis not present

## 2024-05-07 DIAGNOSIS — H04123 Dry eye syndrome of bilateral lacrimal glands: Secondary | ICD-10-CM | POA: Diagnosis not present

## 2024-05-07 DIAGNOSIS — H16103 Unspecified superficial keratitis, bilateral: Secondary | ICD-10-CM | POA: Diagnosis not present

## 2024-05-07 DIAGNOSIS — I10 Essential (primary) hypertension: Secondary | ICD-10-CM | POA: Diagnosis not present

## 2024-05-08 DIAGNOSIS — H04123 Dry eye syndrome of bilateral lacrimal glands: Secondary | ICD-10-CM | POA: Diagnosis not present

## 2024-05-08 DIAGNOSIS — H16103 Unspecified superficial keratitis, bilateral: Secondary | ICD-10-CM | POA: Diagnosis not present

## 2024-05-08 DIAGNOSIS — H04223 Epiphora due to insufficient drainage, bilateral lacrimal glands: Secondary | ICD-10-CM | POA: Diagnosis not present

## 2024-05-12 DIAGNOSIS — G5133 Clonic hemifacial spasm, bilateral: Secondary | ICD-10-CM | POA: Diagnosis not present

## 2024-05-12 DIAGNOSIS — G245 Blepharospasm: Secondary | ICD-10-CM | POA: Diagnosis not present

## 2024-05-12 DIAGNOSIS — H0279 Other degenerative disorders of eyelid and periocular area: Secondary | ICD-10-CM | POA: Diagnosis not present

## 2024-05-12 DIAGNOSIS — G244 Idiopathic orofacial dystonia: Secondary | ICD-10-CM | POA: Diagnosis not present

## 2024-05-12 DIAGNOSIS — G5132 Clonic hemifacial spasm, left: Secondary | ICD-10-CM | POA: Diagnosis not present

## 2024-05-12 DIAGNOSIS — G5131 Clonic hemifacial spasm, right: Secondary | ICD-10-CM | POA: Diagnosis not present

## 2024-05-29 ENCOUNTER — Ambulatory Visit (INDEPENDENT_AMBULATORY_CARE_PROVIDER_SITE_OTHER)

## 2024-05-29 VITALS — BP 149/90 | HR 95 | Ht 67.0 in | Wt 183.0 lb

## 2024-05-29 DIAGNOSIS — Z0000 Encounter for general adult medical examination without abnormal findings: Secondary | ICD-10-CM | POA: Diagnosis not present

## 2024-05-29 NOTE — Progress Notes (Signed)
 Subjective:   Bradley Terry is a 68 y.o. who presents for a Medicare Wellness preventive visit.  As a reminder, Annual Wellness Visits don't include a physical exam, and some assessments may be limited, especially if this visit is performed virtually. We may recommend an in-person follow-up visit with your provider if needed.  Visit Complete: Virtual I connected with  Bradley Terry on 05/29/24 by a audio enabled telemedicine application and verified that I am speaking with the correct person using two identifiers.  Patient Location: Home  Provider Location: Home Office  I discussed the limitations of evaluation and management by telemedicine. The patient expressed understanding and agreed to proceed.  Vital Signs: Because this visit was a virtual/telehealth visit, some criteria may be missing or patient reported. Any vitals not documented were not able to be obtained and vitals that have been documented are patient reported.  VideoDeclined- This patient declined Librarian, academic. Therefore the visit was completed with audio only.  Persons Participating in Visit: Patient.  AWV Questionnaire: No: Patient Medicare AWV questionnaire was not completed prior to this visit.  Cardiac Risk Factors include: advanced age (>72men, >16 women)     Objective:    There were no vitals filed for this visit. There is no height or weight on file to calculate BMI.      No data to display          Current Medications (verified) Outpatient Encounter Medications as of 05/29/2024  Medication Sig   lisinopril  (ZESTRIL ) 10 MG tablet Take 1 tablet (10 mg total) by mouth daily.   Omega-3 Fatty Acids (FISH OIL) 300 MG CAPS Take 1 capsule by mouth daily.   rosuvastatin  (CRESTOR ) 10 MG tablet TAKE 1 TABLET BY MOUTH DAILY. FOR CHOLESTEROL   No facility-administered encounter medications on file as of 05/29/2024.    Allergies (verified) Codeine   History: Past  Medical History:  Diagnosis Date   BPH (benign prostatic hyperplasia)    Colon polyps    Hyperlipidemia    on meds   Hypertension    on meds   Past Surgical History:  Procedure Laterality Date   COLONOSCOPY  2009   DP-F/V-goly(exc)-HPP   SPINE SURGERY  1982, 1986   lumbar surgery   Family History  Problem Relation Age of Onset   COPD Mother    ALS Father    Colon polyps Neg Hx    Colon cancer Neg Hx    Esophageal cancer Neg Hx    Rectal cancer Neg Hx    Stomach cancer Neg Hx    Social History   Socioeconomic History   Marital status: Married    Spouse name: Not on file   Number of children: 2   Years of education: Not on file   Highest education level: Not on file  Occupational History   Not on file  Tobacco Use   Smoking status: Never   Smokeless tobacco: Never  Vaping Use   Vaping status: Never Used  Substance and Sexual Activity   Alcohol use: No   Drug use: No   Sexual activity: Yes    Birth control/protection: Post-menopausal  Other Topics Concern   Not on file  Social History Narrative   Not on file   Social Drivers of Health   Financial Resource Strain: Low Risk  (05/29/2024)   Overall Financial Resource Strain (CARDIA)    Difficulty of Paying Living Expenses: Not hard at all  Food Insecurity: No Food  Insecurity (05/29/2024)   Hunger Vital Sign    Worried About Running Out of Food in the Last Year: Never true    Ran Out of Food in the Last Year: Never true  Transportation Needs: No Transportation Needs (06/26/2019)   PRAPARE - Administrator, Civil Service (Medical): No    Lack of Transportation (Non-Medical): No  Physical Activity: Sufficiently Active (05/29/2024)   Exercise Vital Sign    Days of Exercise per Week: 3 days    Minutes of Exercise per Session: 60 min  Stress: No Stress Concern Present (05/29/2024)   [employer] of Occupational Health - Occupational Stress Questionnaire    Feeling of Stress: Not at all  Social  Connections: Socially Integrated (05/29/2024)   Social Connection and Isolation Panel    Frequency of Communication with Friends and Family: More than three times a week    Frequency of Social Gatherings with Friends and Family: More than three times a week    Attends Religious Services: More than 4 times per year    Active Member of [employer] or Organizations: Yes    Attends Engineer, structural: More than 4 times per year    Marital Status: Married    Tobacco Counseling Counseling given: Not Answered    Clinical Intake:  Pre-visit preparation completed: Yes  Pain : No/denies pain     Nutritional Risks: None Diabetes: No  Lab Results  Component Value Date   HGBA1C 6.0 (H) 10/25/2023     How often do you need to have someone help you when you read instructions, pamphlets, or other written materials from your doctor or pharmacy?: 1 - Never  Interpreter Needed?: No  Information entered by :: alia t/cma   Activities of Daily Living     05/29/2024   11:25 AM  In your present state of health, do you have any difficulty performing the following activities:  Hearing? 0  Vision? 0  Difficulty concentrating or making decisions? 0  Walking or climbing stairs? 0  Dressing or bathing? 0  Doing errands, shopping? 0  Preparing Food and eating ? N  Using the Toilet? N  In the past six months, have you accidently leaked urine? N  Do you have problems with loss of bowel control? N  Managing your Medications? N  Managing your Finances? N  Housekeeping or managing your Housekeeping? N    Patient Care Team: Bradley Lowers, MD as PCP - General (Family Medicine)  I have updated your Care Teams any recent Medical Services you may have received from other providers in the past year.     Assessment:   This is a routine wellness examination for Bradley Terry.  Hearing/Vision screen Hearing Screening - Comments:: Pt denies hearing dif Vision Screening - Comments:: Pt wear  reading glasses/pt goes [facility] In [place]/last ov a months 2025   Goals Addressed   None    Depression Screen     05/29/2024   11:30 AM 10/25/2023   11:20 AM 09/04/2023   11:29 AM 07/05/2023    1:43 PM 09/12/2021    9:50 AM 04/26/2021    3:28 PM 08/14/2019    8:36 AM  PHQ 2/9 Scores  PHQ - 2 Score 0 0 0 0 0 0 0    Fall Risk     05/29/2024   11:23 AM 10/25/2023   11:20 AM 09/04/2023   11:29 AM 09/12/2021    9:50 AM 04/26/2021    3:28 PM  Fall  Risk   Falls in the past year? 0 0 0 0 0  Number falls in past yr: 0 0     Injury with Fall? 0 0     Risk for fall due to : No Fall Risks No Fall Risks     Follow up Falls evaluation completed Falls evaluation completed       MEDICARE RISK AT HOME:  Medicare Risk at Home Any stairs in or around the home?: No If so, are there any without handrails?: No Home free of loose throw rugs in walkways, pet beds, electrical cords, etc?: Yes Adequate lighting in your home to reduce risk of falls?: Yes Life alert?: No Use of a cane, walker or w/c?: No Grab bars in the bathroom?: No Shower chair or bench in shower?: No Elevated toilet seat or a handicapped toilet?: Yes  TIMED UP AND GO:  Was the test performed?  no  Cognitive Function: 6CIT completed        05/29/2024   11:30 AM  6CIT Screen  What Year? 0 points  What month? 3 points  What time? 0 points  Count back from 20 0 points  Months in reverse 0 points  Repeat phrase 0 points  Total Score 3 points    Immunizations Immunization History  Administered Date(s) Administered   Fluad Quad(high Dose 65+) 06/26/2019, 09/12/2021   Fluad Trivalent(High Dose 65+) 09/04/2023   Influenza,inj,Quad PF,6+ Mos 07/05/2017   Moderna Sars-Covid-2 Vaccination 12/31/2019   Pneumococcal Polysaccharide-23 06/26/2019   Tdap 07/05/2017    Screening Tests Health Maintenance  Topic Date Due   Zoster Vaccines- Shingrix (1 of 2) Never done   COVID-19 Vaccine (2 - Moderna risk series)  01/28/2020   INFLUENZA VACCINE  05/02/2024   Pneumococcal Vaccine: 50+ Years (2 of 2 - PCV) 10/23/2024 (Originally 06/25/2020)   Medicare Annual Wellness (AWV)  05/29/2025   DTaP/Tdap/Td (2 - Td or Tdap) 07/06/2027   Hepatitis C Screening  Completed   HPV VACCINES  Aged Out   Meningococcal B Vaccine  Aged Out   Colonoscopy  Discontinued    Health Maintenance  Health Maintenance Due  Topic Date Due   Zoster Vaccines- Shingrix (1 of 2) Never done   COVID-19 Vaccine (2 - Moderna risk series) 01/28/2020   INFLUENZA VACCINE  05/02/2024   Health Maintenance Items Addressed: See Nurse Notes at the end of this note  Additional Screening:  Vision Screening: Recommended annual ophthalmology exams for early detection of glaucoma and other disorders of the eye. Would you like a referral to an eye doctor? No    Dental Screening: Recommended annual dental exams for proper oral hygiene  Community Resource Referral / Chronic Care Management: CRR required this visit?  No   CCM required this visit?  No   Plan:    I have personally reviewed and noted the following in the patient's chart:   Medical and social history Use of alcohol, tobacco or illicit drugs  Current medications and supplements including opioid prescriptions. Patient is not currently taking opioid prescriptions. Functional ability and status Nutritional status Physical activity Advanced directives List of other physicians Hospitalizations, surgeries, and ER visits in previous 12 months Vitals Screenings to include cognitive, depression, and falls Referrals and appointments  In addition, I have reviewed and discussed with patient certain preventive protocols, quality metrics, and best practice recommendations. A written personalized care plan for preventive services as well as general preventive health recommendations were provided to patient.   Boni Maclellan  Debby, [facility]   05/29/2024   After Visit Summary: (MyChart) Due to  this being a telephonic visit, the after visit summary with patients personalized plan was offered to patient via MyChart   Notes: Nothing significant to report at this time.

## 2024-05-29 NOTE — Patient Instructions (Addendum)
 Bradley Terry , Thank you for taking time out of your busy schedule to complete your Annual Wellness Visit with me. I enjoyed our conversation and look forward to speaking with you again next year. I, as well as your care team,  appreciate your ongoing commitment to your health goals. Please review the following plan we discussed and let me know if I can assist you in the future. Your Game plan/ To Do List    Referrals: If you haven't heard from the office you've been referred to, please reach out to them at the phone provided.   Follow up Visits: We will see or speak with you next year for your Next Medicare AWV with our clinical staff on 06/01/24 at 10:40a.m. Have you seen your provider in the last 6 months (3 months if uncontrolled diabetes)? Yes  Clinician Recommendations:  Aim for 30 minutes of exercise or brisk walking, 6-8 glasses of water, and 5 servings of fruits and vegetables each day.       This is a list of the screenings recommended for you:  Health Maintenance  Topic Date Due   Zoster (Shingles) Vaccine (1 of 2) Never done   COVID-19 Vaccine (2 - Moderna risk series) 01/28/2020   Medicare Annual Wellness Visit  07/07/2020   Flu Shot  05/02/2024   Pneumococcal Vaccine for age over 67 (2 of 2 - PCV) 10/23/2024*   DTaP/Tdap/Td vaccine (2 - Td or Tdap) 07/06/2027   Hepatitis C Screening  Completed   HPV Vaccine  Aged Out   Meningitis B Vaccine  Aged Out   Colon Cancer Screening  Discontinued  *Topic was postponed. The date shown is not the original due date.    Advanced directives: (Declined) Advance directive discussed with you today. Even though you declined this today, please call our office should you change your mind, and we can give you the proper paperwork for you to fill out. Advance Care Planning is important because it:  [x]  Makes sure you receive the medical care that is consistent with your values, goals, and preferences  [x]  It provides guidance to your family and  loved ones and reduces their decisional burden about whether or not they are making the right decisions based on your wishes.  Follow the link provided in your after visit summary or read over the paperwork we have mailed to you to help you started getting your Advance Directives in place. If you need assistance in completing these, please reach out to us  so that we can help you!  See attachments for Preventive Care and Fall Prevention Tips.

## 2024-06-04 DIAGNOSIS — H02889 Meibomian gland dysfunction of unspecified eye, unspecified eyelid: Secondary | ICD-10-CM | POA: Diagnosis not present

## 2024-06-04 DIAGNOSIS — H01009 Unspecified blepharitis unspecified eye, unspecified eyelid: Secondary | ICD-10-CM | POA: Diagnosis not present

## 2024-06-04 DIAGNOSIS — H02529 Blepharophimosis unspecified eye, unspecified lid: Secondary | ICD-10-CM | POA: Diagnosis not present

## 2024-06-04 DIAGNOSIS — B880 Other acariasis: Secondary | ICD-10-CM | POA: Diagnosis not present

## 2024-06-25 ENCOUNTER — Ambulatory Visit (INDEPENDENT_AMBULATORY_CARE_PROVIDER_SITE_OTHER): Admitting: Family Medicine

## 2024-06-25 ENCOUNTER — Encounter: Payer: Self-pay | Admitting: Family Medicine

## 2024-06-25 VITALS — BP 136/7 | HR 67 | Temp 98.2°F | Ht 67.0 in | Wt 188.0 lb

## 2024-06-25 DIAGNOSIS — I10 Essential (primary) hypertension: Secondary | ICD-10-CM | POA: Diagnosis not present

## 2024-06-25 DIAGNOSIS — R351 Nocturia: Secondary | ICD-10-CM | POA: Diagnosis not present

## 2024-06-25 DIAGNOSIS — N401 Enlarged prostate with lower urinary tract symptoms: Secondary | ICD-10-CM

## 2024-06-25 DIAGNOSIS — E782 Mixed hyperlipidemia: Secondary | ICD-10-CM | POA: Diagnosis not present

## 2024-06-25 DIAGNOSIS — Z23 Encounter for immunization: Secondary | ICD-10-CM

## 2024-06-25 LAB — LIPID PANEL

## 2024-06-25 NOTE — Progress Notes (Signed)
 Subjective:  Patient ID: Bradley Terry, male    DOB: June 01, 1956  Age: 68 y.o. MRN: [id number]  CC: Medical Management of Chronic Issues   HPI  Discussed the use of AI scribe software for clinical note transcription with the patient, who gave verbal consent to proceed.  History of Present Illness Bradley Terry is a 68 year old male with hypertension and hyperlipidemia who presents for a routine follow-up visit.  He recently returned from a 12-day trip to [place]  where he hiked approximately 80 [place] without experiencing any chest pain or shortness of breath.  He has a history of dry eyes and was previously using Lotemax ointment three times a day but has since discontinued it. He uses over-the-counter eye drops like Vyzee as needed to help with tear production and is planning to see an eye specialist for laser eye surgery.  He is currently taking lisinopril  for hypertension with no side effects such as a nagging cough. He is also taking rosuvastatin  (Crestor ) and omega-3 fatty acids for hyperlipidemia, denying any muscle or joint aches associated with the statin use.  He experiences nasal drainage but denies any associated cough and is not using any medications for this symptom.  He mentions nocturia, averaging getting up once per night to urinate, but it is not significantly disturbing his sleep.          06/25/2024   10:48 AM 05/29/2024   11:30 AM 10/25/2023   11:20 AM  Depression screen PHQ 2/9  Decreased Interest 0 0 0  Down, Depressed, Hopeless 0 0 0  PHQ - 2 Score 0 0 0    History Bradley Terry has a past medical history of BPH (benign prostatic hyperplasia), Colon polyps, Hyperlipidemia, and Hypertension.   He has a past surgical history that includes Spine surgery (1982, 1986) and Colonoscopy (2009).   His family history includes ALS in his father; COPD in his mother.He reports that he has never smoked. He has never used smokeless tobacco. He  reports that he does not drink alcohol and does not use drugs.    ROS Review of Systems  Constitutional:  Negative for fever.  Respiratory:  Negative for shortness of breath.   Cardiovascular:  Negative for chest pain.  Musculoskeletal:  Negative for arthralgias.  Skin:  Negative for rash.    Objective:  BP (!) 136/7   Pulse 67   Temp 98.2 F (36.8 C)   Ht 5' 7 (1.702 m)   Wt 188 lb (85.3 kg)   SpO2 98%   BMI 29.44 kg/m   BP Readings from Last 3 Encounters:  06/25/24 (!) 136/7  05/29/24 (!) 149/90  01/22/24 96/63    Wt Readings from Last 3 Encounters:  06/25/24 188 lb (85.3 kg)  05/29/24 183 lb (83 kg)  01/22/24 175 lb (79.4 kg)     Physical Exam Vitals reviewed.  Constitutional:      Appearance: He is well-developed.  HENT:     Head: Normocephalic and atraumatic.     Right Ear: External ear normal.     Left Ear: External ear normal.     Mouth/Throat:     Pharynx: No oropharyngeal exudate or posterior oropharyngeal erythema.  Eyes:     Pupils: Pupils are equal, round, and reactive to light.  Cardiovascular:     Rate and Rhythm: Normal rate and regular rhythm.     Heart sounds: No murmur heard. Pulmonary:     Effort: No respiratory distress.  Breath sounds: Normal breath sounds.  Musculoskeletal:     Cervical back: Normal range of motion and neck supple.  Neurological:     Mental Status: He is alert and oriented to person, place, and time.      Assessment & Plan:  Encounter for immunization -     Flu vaccine HIGH DOSE PF(Fluzone Trivalent)  Primary hypertension -     CBC with Differential/Platelet -     CMP14+EGFR  Mixed hyperlipidemia -     CBC with Differential/Platelet -     CMP14+EGFR -     Lipid panel  Benign prostatic hyperplasia with nocturia -     CBC with Differential/Platelet -     CMP14+EGFR    Assessment and Plan Assessment & Plan Primary hypertension   Blood pressure is well-controlled at 136/78 mmHg with lisinopril ,  with no side effects. This level is acceptable for his age. Continue lisinopril  10 mg daily.  Mixed hyperlipidemia   Managed with rosuvastatin  and omega-3 fatty acids, with no muscle or joint aches reported. Continue rosuvastatin  10 mg daily and fish oil 300 mg daily. Order lab tests for cholesterol levels.  Benign prostatic hyperplasia with nocturia   Experiences nocturia once per night without significant sleep disturbance. Discussed potential use of tamsulosin  if symptoms worsen, as it may improve urine flow without significantly affecting blood pressure.  Dry eye syndrome   Previously used Lotemax ointment, now discontinued. Plans to consult an eye specialist for laser eye surgery. Currently using over-the-counter eye drops as needed.       Follow-up: Return in about 6 months (around 12/23/2024) for [employer] physical.  Butler Der, M.D.

## 2024-06-26 LAB — CBC WITH DIFFERENTIAL/PLATELET
Basophils Absolute: 0 x10E3/uL (ref 0.0–0.2)
Basos: 1 %
EOS (ABSOLUTE): 0.1 x10E3/uL (ref 0.0–0.4)
Eos: 2 %
Hematocrit: 50.4 % (ref 37.5–51.0)
Hemoglobin: 17 g/dL (ref 13.0–17.7)
Immature Grans (Abs): 0 x10E3/uL (ref 0.0–0.1)
Immature Granulocytes: 0 %
Lymphocytes Absolute: 1.5 x10E3/uL (ref 0.7–3.1)
Lymphs: 27 %
MCH: 31.7 pg (ref 26.6–33.0)
MCHC: 33.7 g/dL (ref 31.5–35.7)
MCV: 94 fL (ref 79–97)
Monocytes Absolute: 0.6 x10E3/uL (ref 0.1–0.9)
Monocytes: 11 %
Neutrophils Absolute: 3.1 x10E3/uL (ref 1.4–7.0)
Neutrophils: 59 %
Platelets: 218 x10E3/uL (ref 150–450)
RBC: 5.37 x10E6/uL (ref 4.14–5.80)
RDW: 12.5 % (ref 11.6–15.4)
WBC: 5.3 x10E3/uL (ref 3.4–10.8)

## 2024-06-26 LAB — CMP14+EGFR
ALT: 26 IU/L (ref 0–44)
AST: 26 IU/L (ref 0–40)
Albumin: 4.6 g/dL (ref 3.9–4.9)
Alkaline Phosphatase: 60 IU/L (ref 47–123)
BUN/Creatinine Ratio: 22 (ref 10–24)
BUN: 21 mg/dL (ref 8–27)
Bilirubin Total: 0.7 mg/dL (ref 0.0–1.2)
CO2: 22 mmol/L (ref 20–29)
Calcium: 9.4 mg/dL (ref 8.6–10.2)
Chloride: 101 mmol/L (ref 96–106)
Creatinine, Ser: 0.97 mg/dL (ref 0.76–1.27)
Globulin, Total: 2.8 g/dL (ref 1.5–4.5)
Glucose: 93 mg/dL (ref 70–99)
Potassium: 4.6 mmol/L (ref 3.5–5.2)
Sodium: 138 mmol/L (ref 134–144)
Total Protein: 7.4 g/dL (ref 6.0–8.5)
eGFR: 84 mL/min/1.73 (ref >59)

## 2024-06-26 LAB — LIPID PANEL
Cholesterol, Total: 162 mg/dL (ref 100–199)
HDL: 74 mg/dL (ref >39)
LDL CALC COMMENT:: 2.2 ratio (ref 0.0–5.0)
LDL Chol Calc (NIH): 79 mg/dL (ref 0–99)
Triglycerides: 42 mg/dL (ref 0–149)
VLDL Cholesterol Cal: 9 mg/dL (ref 5–40)

## 2024-06-27 ENCOUNTER — Ambulatory Visit: Payer: Self-pay | Admitting: Family Medicine

## 2024-06-27 ENCOUNTER — Other Ambulatory Visit: Payer: Self-pay | Admitting: Family Medicine

## 2024-06-27 NOTE — Progress Notes (Signed)
Hello Bradley Terry,  Your lab result is normal and/or stable.Some minor variations that are not significant are commonly marked abnormal, but do not represent any medical problem for you.  Best regards, Claretta Fraise, M.D.

## 2024-08-12 ENCOUNTER — Other Ambulatory Visit: Payer: Self-pay | Admitting: Family Medicine

## 2024-12-24 ENCOUNTER — Encounter: Payer: Self-pay | Admitting: Family Medicine

## 2025-06-01 ENCOUNTER — Ambulatory Visit: Payer: Self-pay
